# Patient Record
Sex: Male | Born: 1955 | Race: White | Hispanic: No | Marital: Married | Smoking: Former smoker
Health system: Southern US, Community
[De-identification: ages and names within clinical notes are randomized; demographics above are authoritative.]

## PROBLEM LIST (undated history)

## (undated) DIAGNOSIS — I10 Essential (primary) hypertension: Secondary | ICD-10-CM

## (undated) HISTORY — PX: LYMPHANGIOMA EXCISION: SHX1990

## (undated) HISTORY — PX: HAND SURGERY: SHX662

## (undated) HISTORY — PX: COLONOSCOPY: SHX174

## (undated) HISTORY — PX: CATARACT EXTRACTION: SUR2

## (undated) HISTORY — DX: Essential (primary) hypertension: I10

## (undated) HISTORY — PX: TONSILLECTOMY: SUR1361

## (undated) HISTORY — PX: CARDIAC VALVE REPLACEMENT: SHX585

## (undated) HISTORY — PX: HERNIA REPAIR: SHX51

## (undated) HISTORY — PX: VARICOSE VEIN SURGERY: SHX832

---

## 1998-08-05 ENCOUNTER — Ambulatory Visit (HOSPITAL_COMMUNITY): Admission: RE | Admit: 1998-08-05 | Discharge: 1998-08-05 | Payer: Self-pay | Admitting: Vascular Surgery

## 1998-08-22 ENCOUNTER — Ambulatory Visit (HOSPITAL_COMMUNITY): Admission: RE | Admit: 1998-08-22 | Discharge: 1998-08-22 | Payer: Self-pay | Admitting: Vascular Surgery

## 1998-10-14 ENCOUNTER — Ambulatory Visit (HOSPITAL_COMMUNITY): Admission: RE | Admit: 1998-10-14 | Discharge: 1998-10-14 | Payer: Self-pay | Admitting: General Surgery

## 2011-08-17 ENCOUNTER — Ambulatory Visit
Admission: RE | Admit: 2011-08-17 | Discharge: 2011-08-17 | Disposition: A | Discharge status: Home / Self Care | Payer: BC Managed Care – PPO | Source: Ambulatory Visit | Attending: Physician Assistant | Admitting: Physician Assistant

## 2011-08-17 ENCOUNTER — Other Ambulatory Visit: Payer: Self-pay | Admitting: Physician Assistant

## 2011-08-17 DIAGNOSIS — R059 Cough, unspecified: Secondary | ICD-10-CM

## 2011-08-17 DIAGNOSIS — R05 Cough: Secondary | ICD-10-CM

## 2012-05-30 ENCOUNTER — Other Ambulatory Visit: Payer: Self-pay | Admitting: Family Medicine

## 2012-05-30 DIAGNOSIS — E785 Hyperlipidemia, unspecified: Secondary | ICD-10-CM

## 2012-05-30 DIAGNOSIS — G43909 Migraine, unspecified, not intractable, without status migrainosus: Secondary | ICD-10-CM

## 2012-05-30 DIAGNOSIS — I10 Essential (primary) hypertension: Secondary | ICD-10-CM

## 2012-06-09 ENCOUNTER — Other Ambulatory Visit: Payer: Self-pay | Admitting: Family Medicine

## 2012-06-09 DIAGNOSIS — Z139 Encounter for screening, unspecified: Secondary | ICD-10-CM

## 2012-06-12 ENCOUNTER — Ambulatory Visit
Admission: RE | Admit: 2012-06-12 | Discharge: 2012-06-12 | Disposition: A | Discharge status: Home / Self Care | Payer: BC Managed Care – PPO | Source: Ambulatory Visit | Attending: Family Medicine | Admitting: Family Medicine

## 2012-06-12 DIAGNOSIS — Z139 Encounter for screening, unspecified: Secondary | ICD-10-CM

## 2012-06-12 DIAGNOSIS — I10 Essential (primary) hypertension: Secondary | ICD-10-CM

## 2012-06-12 DIAGNOSIS — E785 Hyperlipidemia, unspecified: Secondary | ICD-10-CM

## 2012-06-12 DIAGNOSIS — G43909 Migraine, unspecified, not intractable, without status migrainosus: Secondary | ICD-10-CM

## 2012-06-12 MED ORDER — GADOBENATE DIMEGLUMINE 529 MG/ML IV SOLN
20.0000 mL | Freq: Once | INTRAVENOUS | Status: AC | PRN
  Administered 2012-06-12: 20 mL via INTRAVENOUS

## 2014-02-19 ENCOUNTER — Ambulatory Visit (INDEPENDENT_AMBULATORY_CARE_PROVIDER_SITE_OTHER): Payer: BC Managed Care – PPO | Admitting: Family Medicine

## 2014-02-19 VITALS — BP 128/80 | HR 60 | Temp 98.4°F | Resp 17 | Ht 73.0 in | Wt 196.0 lb

## 2014-02-19 DIAGNOSIS — F411 Generalized anxiety disorder: Secondary | ICD-10-CM

## 2014-02-19 NOTE — Progress Notes (Signed)
Subjective:  This chart was scribed for Robyn Haber, MD by Donato Schultz, Medical Scribe. This patient was seen in Room 9 and the patient's care was started at 6:03 PM.   Patient ID: Matthew Kidd, male    DOB: 05-07-1956, 58 y.o.   MRN: [id number]  HPI HPI Comments: Matthew Kidd is a 58 y.o. male who presents to the [facility] complaining of increasingly worsening anxiety.  The patient states that he had a panic attack that occurred last night after he woke up from a dream that he was registering for Masters courses at [employer] and had everything taken from him.  He states that everyone else in the dream had direction except for him.  The patient's wife states that the panic attack lasted for 30 minutes and seemed like a psychotic episode as he kept screaming about registering for classes.  The patient states that he woke up at 4 AM this morning to let the dogs out, which is not abnormal for him, but he had a hard time going back to sleep because he was preoccupied with thoughts about work.  The patient's wife states that the patient has become increasingly negative and angry.  He states that he was given Xanax by Dr. Burna Mortimer but states that it has not helped his anxiety because it makes him sleepy.  The patient states that he has taken Seratonin inhibitors for his symptoms in the past with no relief.  The patient oversees an EMT program.  He states that work stresses him out because he is responsible for overseeing the entire program and he worries a lot for and about his students.  The patient states that he is interested in seeing a therapist but does not want anyone at his workplace to know about it.  He also states that he blames himself for his daughter getting a DUI.    Past Medical History  Diagnosis Date   Hypertension    Past Surgical History  Procedure Laterality Date   Hernia repair     Cardiac valve replacement     Family History  Problem Relation Age  of Onset   Cancer Mother    Cancer Father    Hypertension Father    Stroke Father    History   Social History   Marital Status: Married    Spouse Name: N/A    Number of Children: N/A   Years of Education: N/A   Occupational History   Not on file.   Social History Main Topics   Smoking status: Never Smoker    Smokeless tobacco: Not on file   Alcohol Use: No   Drug Use: No   Sexual Activity: No   Other Topics Concern   Not on file   Social History Narrative   No narrative on file   Allergies  Allergen Reactions   Codeine Other (See Comments)    Headache     Review of Systems  Psychiatric/Behavioral: Positive for sleep disturbance. The patient is nervous/anxious.   All other systems reviewed and are negative.     Objective:  Physical Exam I spent about 30 minutes he stays with patient him and his wife about his anxiety, anger issues, and delusional night last night.    BP 128/80   Pulse 60   Temp(Src) 98.4 F (36.9 C) (Oral)   Resp 17   Ht 6\' 1"  (1.854 m)   Wt 196 lb (88.905 kg)   BMI 25.86 kg/m2  SpO2 98% Assessment & Plan:  I personally performed the services described in this documentation, which was scribed in my presence. The recorded information has been reviewed and is accurate. Referral to psychiatry urgent  Signed, Carola Frost.D.

## 2016-10-06 ENCOUNTER — Other Ambulatory Visit: Payer: Self-pay | Admitting: Family Medicine

## 2016-10-06 ENCOUNTER — Ambulatory Visit
Admission: RE | Admit: 2016-10-06 | Discharge: 2016-10-06 | Disposition: A | Discharge status: Home / Self Care | Payer: BC Managed Care – PPO | Source: Ambulatory Visit | Attending: Family Medicine | Admitting: Family Medicine

## 2016-10-06 DIAGNOSIS — M25571 Pain in right ankle and joints of right foot: Secondary | ICD-10-CM

## 2016-11-30 ENCOUNTER — Encounter: Payer: Self-pay | Admitting: Podiatry

## 2016-11-30 ENCOUNTER — Ambulatory Visit (INDEPENDENT_AMBULATORY_CARE_PROVIDER_SITE_OTHER): Payer: BC Managed Care – PPO

## 2016-11-30 ENCOUNTER — Ambulatory Visit (INDEPENDENT_AMBULATORY_CARE_PROVIDER_SITE_OTHER): Payer: BC Managed Care – PPO | Admitting: Podiatry

## 2016-11-30 VITALS — BP 130/83 | HR 63 | Resp 16

## 2016-11-30 DIAGNOSIS — M79671 Pain in right foot: Secondary | ICD-10-CM

## 2016-11-30 DIAGNOSIS — M84374A Stress fracture, right foot, initial encounter for fracture: Secondary | ICD-10-CM

## 2016-11-30 MED ORDER — MELOXICAM 15 MG PO TABS: 15.0000 mg | ORAL_TABLET | Freq: Every day | ORAL | 3 refills | Status: DC

## 2016-11-30 NOTE — Progress Notes (Signed)
   Subjective:    Patient ID: Matthew Kidd, male    DOB: [birth year], 61 y.o.   MRN: [id number]  HPI: He presents today with chief complaint of pain to the right forefoot he says that approximately 2 months ago he jumped off his tractor landing on his foot with immediate pain. States the addendum is many times without pain but he immediately developed redness and swelling over the metatarsophalangeal joints 2 through 4 right foot he saw his primary care performed x-rays stated that there were no fractures he still having pain 2 months later and he states that it is getting better. He states that it feels better with stiff soled shoes. He just wants a second opinion today.    Review of Systems  Musculoskeletal: Positive for arthralgias.  Neurological: Positive for headaches.  All other systems reviewed and are negative.      Objective:   Physical Exam: Vital signs are stable he is alert and oriented 3. Pulses are palpable. Neurologic sensorium is intact. A tendon reflexes are intact. Muscle strength is 5 over 5 dorsiflexion plantar flexors and inverters everters on his musculature is intact. Orthopedic evaluation does not demonstrate any type of soft tissue abnormality overlying the metatarsophalangeal joints. He does have pain on palpation and only and range of motion of the metatarsophalangeal joints 234 with the worst being second and third. Radiographs taken today compared to previous demonstrate radiolucency in the heads of the second and third metatarsal into the medial aspect of the head of the fourth metatarsal on the right foot. This is a stress fracture of these metatarsal heads with no displacement there is no bone callus related with this cancellous fracture.        Assessment & Plan:  Assessment: Healing stress fractures metatarsals #2 #3 #4 right foot.  Plan: Continue to wear solid soled shoes for the next month or so no bare feet no flip flops or sandals. Follow-up with me in  a month if this is not resolving.

## 2017-06-17 ENCOUNTER — Other Ambulatory Visit: Payer: Self-pay | Admitting: Family Medicine

## 2017-06-17 DIAGNOSIS — R1030 Lower abdominal pain, unspecified: Secondary | ICD-10-CM

## 2018-02-13 IMAGING — CR DG FOOT COMPLETE 3+V*R*
3 series · 3 of 3 positions shown · non-contrast
Comparison: No recent prior.

CLINICAL DATA: Foot pain.  Injury.

EXAM:
RIGHT FOOT COMPLETE - 3+ VIEW

[x foot ap right]
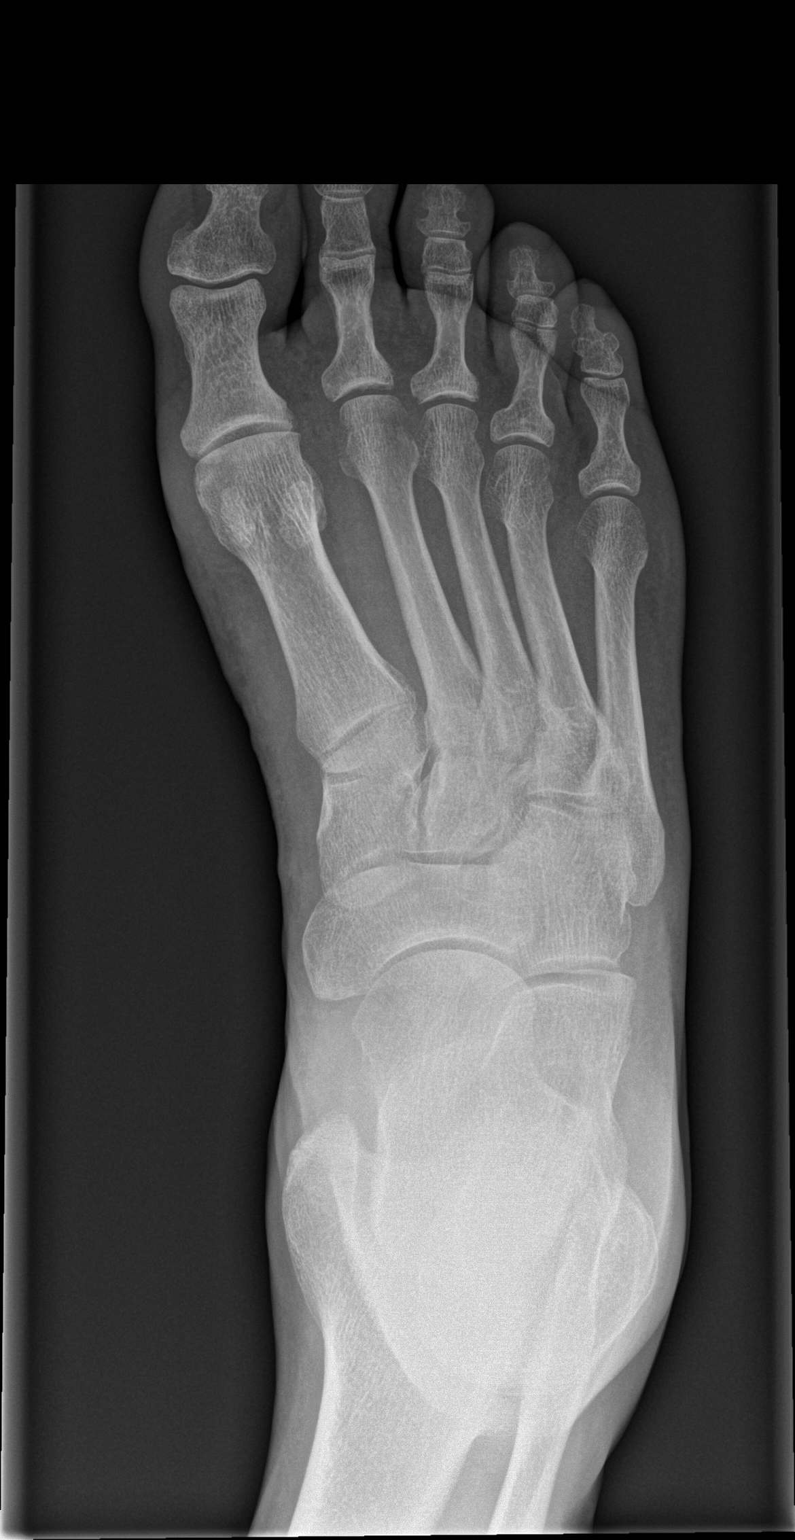

[x foot obl right]
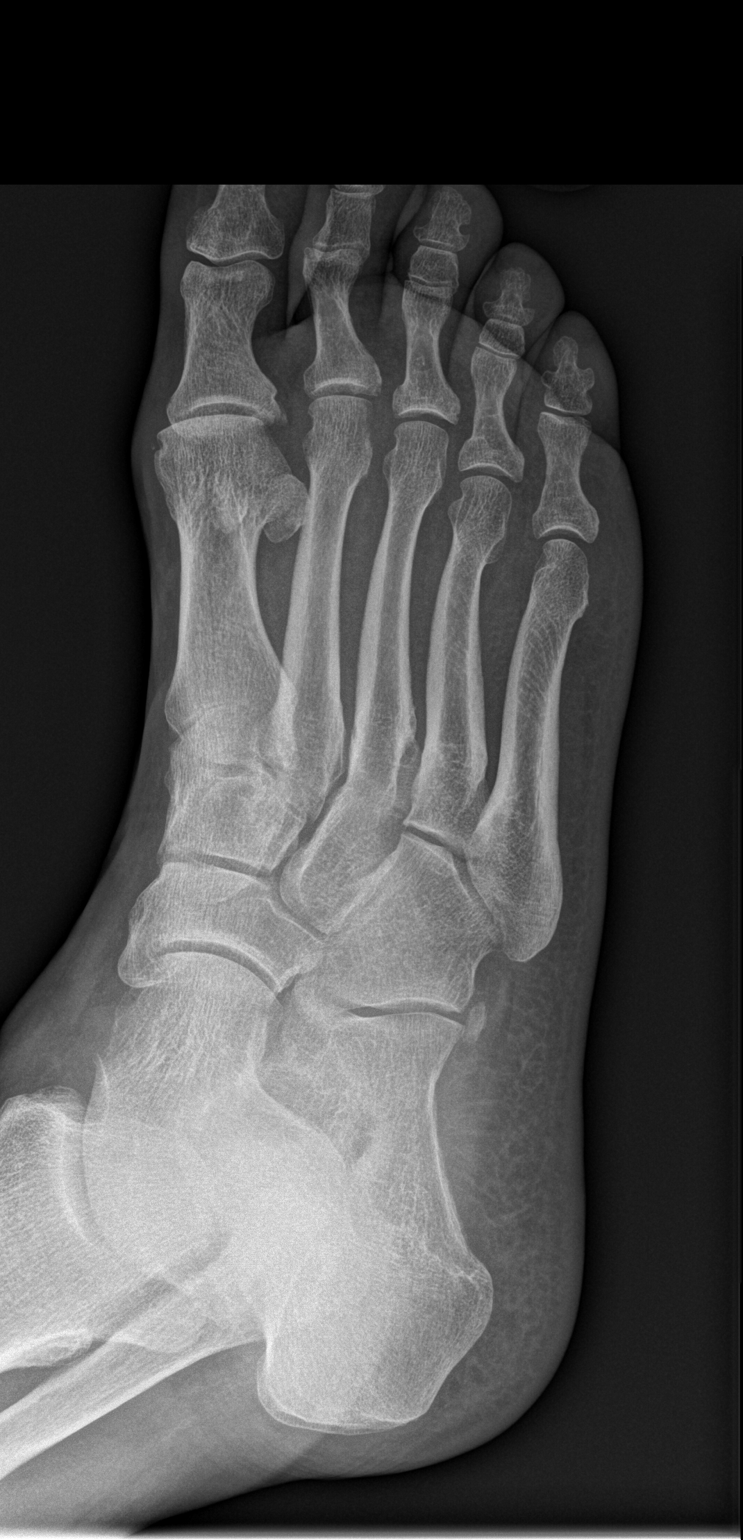

[x foot lat right]
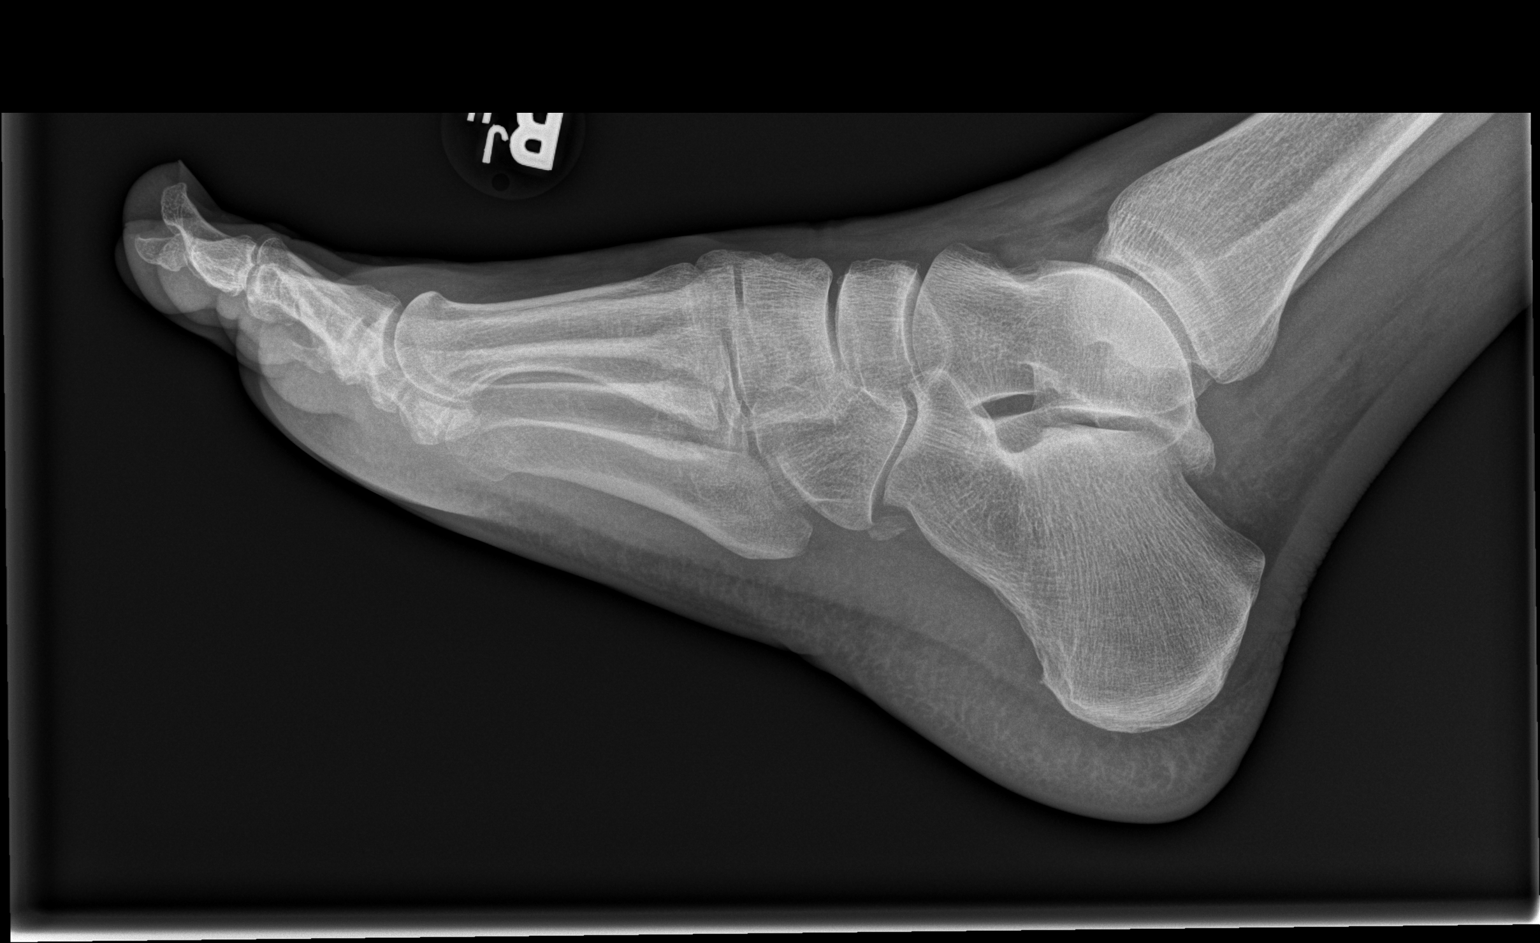

[3 of 3 positions shown; findings below may reference images not displayed]

FINDINGS: No acute bony or joint abnormality identified. No evidence of
fracture dislocation.
IMPRESSION: Diffuse mild degenerative change.  No acute abnormality.

## 2020-01-15 DIAGNOSIS — Z86010 Personal history of colonic polyps: Secondary | ICD-10-CM | POA: Insufficient documentation

## 2020-12-23 ENCOUNTER — Other Ambulatory Visit: Payer: Self-pay

## 2020-12-23 ENCOUNTER — Encounter: Payer: Self-pay | Admitting: Dermatology

## 2020-12-23 ENCOUNTER — Ambulatory Visit: Payer: Medicare PPO | Admitting: Dermatology

## 2020-12-23 DIAGNOSIS — Z1283 Encounter for screening for malignant neoplasm of skin: Secondary | ICD-10-CM | POA: Diagnosis not present

## 2020-12-23 DIAGNOSIS — I878 Other specified disorders of veins: Secondary | ICD-10-CM

## 2020-12-23 DIAGNOSIS — D485 Neoplasm of uncertain behavior of skin: Secondary | ICD-10-CM

## 2020-12-23 DIAGNOSIS — L570 Actinic keratosis: Secondary | ICD-10-CM

## 2020-12-23 DIAGNOSIS — C4492 Squamous cell carcinoma of skin, unspecified: Secondary | ICD-10-CM

## 2020-12-23 DIAGNOSIS — L821 Other seborrheic keratosis: Secondary | ICD-10-CM | POA: Diagnosis not present

## 2020-12-23 DIAGNOSIS — D0439 Carcinoma in situ of skin of other parts of face: Secondary | ICD-10-CM

## 2020-12-23 HISTORY — DX: Squamous cell carcinoma of skin, unspecified: C44.92

## 2020-12-23 NOTE — Patient Instructions (Signed)

## 2020-12-28 ENCOUNTER — Encounter: Payer: Self-pay | Admitting: Dermatology

## 2020-12-28 NOTE — Progress Notes (Signed)
Follow-Up Visit   Subjective  Matthew Kidd is a 65 y.o. male who presents for the following: Annual Exam (Right brow odd red per patient previous ak, check post neck x years).  General skin examination Location: Possible new spots above right eyebrow and in front of right ear Duration:  Quality:  Associated Signs/Symptoms: Modifying Factors:  Severity:  Timing: Context:   Objective  Well appearing patient in no apparent distress; mood and affect are within normal limits. Objective  Chest - Medial [facility]): Head to toe exam today no signs of atypical moles or melanoma.  Objective  Right Preauricular Area: Endophytic 6 mm pink waxy crust       Objective  Above Right Brow:  Two 4 mm waxy pink crusts on either side of a 4 mm white scar       Objective  Left Chest: Light brown textured 6 mm flattopped papules  Objective  Right Scrotum: 3 mm nontender mobile firm subcutaneous nodule adjacent to a small scrotal varicosity  Objective  Left Superior Helix, Right Scaphoid Fossa: 2 to 4 mm hornlike crusts   A full examination was performed including scalp, head, eyes, ears, nose, lips, neck, chest, axillae, abdomen, back, buttocks, bilateral upper extremities, bilateral lower extremities, hands, feet, fingers, toes, fingernails, and toenails. All findings within normal limits unless otherwise noted below.   Assessment & Plan    Screening exam for skin cancer Chest - Medial [facility])  Annual skin examination  Neoplasm of uncertain behavior of skin (2) Right Preauricular Area  Skin / nail biopsy Type of biopsy: tangential   Informed consent: discussed and consent obtained   Timeout: patient name, date of birth, surgical site, and procedure verified   Procedure prep:  Patient was prepped and draped in usual sterile fashion (Non sterile) Prep type:  Chlorhexidine Anesthesia: the lesion was anesthetized in a standard fashion   Anesthetic:  1% lidocaine  w/ epinephrine 1-100,000 local infiltration Instrument used: flexible razor blade   Hemostasis achieved with: ferric subsulfate   Outcome: patient tolerated procedure well   Post-procedure details: sterile dressing applied and wound care instructions given   Dressing type: petrolatum and bandage    Specimen 1 - Surgical pathology Differential Diagnosis: R/O BCC vs SCC  Check Margins: No  Above Right Brow  Skin / nail biopsy Type of biopsy: tangential   Informed consent: discussed and consent obtained   Timeout: patient name, date of birth, surgical site, and procedure verified   Procedure prep:  Patient was prepped and draped in usual sterile fashion (Non sterile) Prep type:  Chlorhexidine Anesthesia: the lesion was anesthetized in a standard fashion   Anesthetic:  1% lidocaine w/ epinephrine 1-100,000 local infiltration Instrument used: flexible razor blade   Hemostasis achieved with: ferric subsulfate   Outcome: patient tolerated procedure well   Post-procedure details: sterile dressing applied and wound care instructions given   Dressing type: bandage and petrolatum    Specimen 2 - Surgical pathology Differential Diagnosis: R/O BCC vs SCC  Check Margins: No  [id number]  Seborrheic keratosis Left Chest  Leave if stable  Phlebolith Right Scrotum  Historically asymptomatic and stable; recheck if changes  AK (actinic keratosis) (2) Left Superior Helix; Right Scaphoid Fossa  Destruction of lesion - Left Superior Helix, Right Scaphoid Fossa Complexity: simple   Destruction method: cryotherapy   Informed consent: discussed and consent obtained   Timeout:  patient name, date of birth, surgical site, and procedure verified Lesion destroyed using liquid nitrogen: Yes  Cryotherapy cycles:  5 Outcome: patient tolerated procedure well with no complications   Post-procedure details: wound care instructions given       I, Lavonna Monarch, MD, have reviewed all  documentation for this visit.  The documentation on 12/28/20 for the exam, diagnosis, procedures, and orders are all accurate and complete.

## 2020-12-31 ENCOUNTER — Telehealth: Payer: Self-pay

## 2020-12-31 NOTE — Telephone Encounter (Signed)
Phone call to patient with his pathology results.  Pathology results given to patient's wife.

## 2020-12-31 NOTE — Telephone Encounter (Signed)
-----   Message from Lavonna Monarch, MD sent at 12/30/2020  5:57 AM EST ----- Schedule surgery with Dr. Darene Lamer

## 2021-02-10 DIAGNOSIS — E7800 Pure hypercholesterolemia, unspecified: Secondary | ICD-10-CM | POA: Diagnosis not present

## 2021-02-10 DIAGNOSIS — I10 Essential (primary) hypertension: Secondary | ICD-10-CM | POA: Diagnosis not present

## 2021-02-10 DIAGNOSIS — W57XXXA Bitten or stung by nonvenomous insect and other nonvenomous arthropods, initial encounter: Secondary | ICD-10-CM | POA: Diagnosis not present

## 2021-02-10 DIAGNOSIS — G43909 Migraine, unspecified, not intractable, without status migrainosus: Secondary | ICD-10-CM | POA: Diagnosis not present

## 2021-02-26 ENCOUNTER — Ambulatory Visit (INDEPENDENT_AMBULATORY_CARE_PROVIDER_SITE_OTHER): Payer: Medicare PPO | Admitting: Dermatology

## 2021-02-26 ENCOUNTER — Encounter: Payer: Self-pay | Admitting: Dermatology

## 2021-02-26 ENCOUNTER — Other Ambulatory Visit: Payer: Self-pay

## 2021-02-26 DIAGNOSIS — D485 Neoplasm of uncertain behavior of skin: Secondary | ICD-10-CM

## 2021-02-26 DIAGNOSIS — D044 Carcinoma in situ of skin of scalp and neck: Secondary | ICD-10-CM | POA: Diagnosis not present

## 2021-02-26 DIAGNOSIS — D0439 Carcinoma in situ of skin of other parts of face: Secondary | ICD-10-CM

## 2021-02-26 DIAGNOSIS — D099 Carcinoma in situ, unspecified: Secondary | ICD-10-CM

## 2021-02-26 DIAGNOSIS — L570 Actinic keratosis: Secondary | ICD-10-CM | POA: Diagnosis not present

## 2021-02-26 NOTE — Patient Instructions (Signed)

## 2021-03-14 ENCOUNTER — Encounter: Payer: Self-pay | Admitting: Dermatology

## 2021-03-14 NOTE — Progress Notes (Signed)
Follow-Up Visit   Subjective  Matthew Kidd is a 65 y.o. male who presents for the following: Procedure (Scc x1 ).  Biopsy-proven CIS left cheek plus several other crusts Location:  Duration:  Quality:  Associated Signs/Symptoms: Modifying Factors:  Severity:  Timing: Context:   Objective  Well appearing patient in no apparent distress; mood and affect are within normal limits. Objective  Right Preauricular Area: Lesion identified by Dr.Kaydin Karbowski and nurse in room.    Objective  Left Scaphoid Fossa: 3 mm hornlike pink crust  Objective  Left Anterior Neck: 1.2 cm thick pink crust    A focused examination was performed including Head and neck.. Relevant physical exam findings are noted in the Assessment and Plan.   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  Destruction of lesion Complexity: simple   Destruction method: electrodesiccation and curettage   Informed consent: discussed and consent obtained   Timeout:  patient name, date of birth, surgical site, and procedure verified Anesthesia: the lesion was anesthetized in a standard fashion   Anesthetic:  1% lidocaine w/ epinephrine 1-100,000 local infiltration Curettage performed in three different directions: Yes   Curettage cycles:  3 Lesion length (cm):  1 Lesion width (cm):  1 Margin per side (cm):  0 Final wound size (cm):  1 Hemostasis achieved with:  ferric subsulfate Outcome: patient tolerated procedure well with no complications   Additional details:  Wound innoculated with 5 fluorouracil solution.  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  Skin / nail biopsy Type of biopsy: tangential   Informed consent: discussed and consent obtained   Timeout: patient name, date of birth, surgical site, and procedure verified   Procedure prep:  Patient was prepped and draped in usual sterile fashion (Non sterile) Prep type:  Chlorhexidine Anesthesia: the lesion was anesthetized in a standard fashion   Anesthetic:  1% lidocaine w/ epinephrine 1-100,000 local infiltration Instrument used: flexible razor blade   Outcome: patient tolerated procedure well   Post-procedure details: wound care instructions given    Destruction of lesion Complexity: simple   Destruction method: electrodesiccation and curettage   Informed consent: discussed and consent obtained   Timeout:  patient name, date of birth, surgical site, and procedure verified Anesthesia: the lesion was anesthetized in a standard fashion   Anesthetic:  1% lidocaine w/ epinephrine 1-100,000 local infiltration Curettage performed in three different directions: Yes   Curettage cycles:  3 Lesion length (cm):  1.6 Lesion width (cm):  1.6 Margin per side (cm):  0 Final wound size (cm):  1.6 Hemostasis achieved with:  ferric subsulfate Outcome: patient tolerated procedure well with no complications   Additional details:  Wound innoculated with 5 fluorouracil solution.  Specimen 1 - Surgical pathology Differential Diagnosis: bcc scc tx with curet and cautery 61fu  Check Margins: No  After shave biopsy the base was treated with triple curettage and cautery plus inoculated with parenteral fluorouracil.      I, Lavonna Monarch, MD, have reviewed  all documentation for this visit.  The documentation on 03/14/21 for the exam, diagnosis, procedures, and orders are all accurate and complete.

## 2021-03-26 DIAGNOSIS — H2513 Age-related nuclear cataract, bilateral: Secondary | ICD-10-CM | POA: Diagnosis not present

## 2021-04-09 DIAGNOSIS — H2511 Age-related nuclear cataract, right eye: Secondary | ICD-10-CM | POA: Diagnosis not present

## 2021-04-09 DIAGNOSIS — H353132 Nonexudative age-related macular degeneration, bilateral, intermediate dry stage: Secondary | ICD-10-CM | POA: Diagnosis not present

## 2021-04-24 DIAGNOSIS — H2511 Age-related nuclear cataract, right eye: Secondary | ICD-10-CM | POA: Diagnosis not present

## 2021-05-11 DIAGNOSIS — H2513 Age-related nuclear cataract, bilateral: Secondary | ICD-10-CM | POA: Diagnosis not present

## 2021-05-11 DIAGNOSIS — H2512 Age-related nuclear cataract, left eye: Secondary | ICD-10-CM | POA: Diagnosis not present

## 2021-06-20 DIAGNOSIS — R519 Headache, unspecified: Secondary | ICD-10-CM | POA: Diagnosis not present

## 2021-06-20 DIAGNOSIS — U071 COVID-19: Secondary | ICD-10-CM | POA: Diagnosis not present

## 2021-06-20 DIAGNOSIS — R0981 Nasal congestion: Secondary | ICD-10-CM | POA: Diagnosis not present

## 2021-06-20 DIAGNOSIS — R110 Nausea: Secondary | ICD-10-CM | POA: Diagnosis not present

## 2021-08-18 DIAGNOSIS — E7800 Pure hypercholesterolemia, unspecified: Secondary | ICD-10-CM | POA: Diagnosis not present

## 2021-08-18 DIAGNOSIS — N486 Induration penis plastica: Secondary | ICD-10-CM | POA: Diagnosis not present

## 2021-08-18 DIAGNOSIS — I10 Essential (primary) hypertension: Secondary | ICD-10-CM | POA: Diagnosis not present

## 2021-08-18 DIAGNOSIS — Z125 Encounter for screening for malignant neoplasm of prostate: Secondary | ICD-10-CM | POA: Diagnosis not present

## 2021-09-15 ENCOUNTER — Ambulatory Visit: Payer: Medicare PPO | Admitting: Podiatry

## 2021-09-15 ENCOUNTER — Other Ambulatory Visit: Payer: Self-pay

## 2021-09-15 ENCOUNTER — Ambulatory Visit (INDEPENDENT_AMBULATORY_CARE_PROVIDER_SITE_OTHER): Payer: Medicare PPO

## 2021-09-15 ENCOUNTER — Encounter: Payer: Self-pay | Admitting: Podiatry

## 2021-09-15 DIAGNOSIS — M778 Other enthesopathies, not elsewhere classified: Secondary | ICD-10-CM

## 2021-09-15 MED ORDER — METHYLPREDNISOLONE 4 MG PO TBPK: ORAL_TABLET | ORAL | 0 refills | Status: DC

## 2021-09-16 NOTE — Progress Notes (Signed)
Subjective:  Patient ID: Matthew Kidd, male    DOB: 1956-10-06,  MRN: [id number] HPI Chief Complaint  Patient presents with   Foot Pain    1st MPJ left - area was red, swollen, purple and very painful x 2 weeks ago, better, but still some discomfort, limited ROM, some 5th met base pain   Toe Pain    Hallux right - lateral border, tender x few weeks   New Patient (Initial Visit)    Est pt 2018    65 y.o. male presents with the above complaint.   ROS: Denies fever chills nausea vomiting muscle aches pains calf pain back pain chest pain shortness of breath.  Past Medical History:  Diagnosis Date   Hypertension    SCCA (squamous cell carcinoma) of skin 12/23/2020   Right Preauricular Area (in situ)   Past Surgical History:  Procedure Laterality Date   CARDIAC VALVE REPLACEMENT     HERNIA REPAIR      Current Outpatient Medications:    hydrochlorothiazide (HYDRODIURIL) 12.5 MG tablet, Take 12.5 mg by mouth daily., Disp: , Rfl:    methylPREDNISolone (MEDROL DOSEPAK) 4 MG TBPK tablet, 6 day dose pack - take as directed, Disp: 21 tablet, Rfl: 0   metoprolol succinate (TOPROL-XL) 100 MG 24 hr tablet, Take 100 mg by mouth daily. Take with or immediately following a meal., Disp: , Rfl:    olmesartan (BENICAR) 40 MG tablet, Take 1 tablet by mouth daily., Disp: , Rfl:   Allergies  Allergen Reactions   Codeine Other (See Comments)    Headache    Review of Systems Objective:  There were no vitals filed for this visit.  General: Well developed, nourished, in no acute distress, alert and oriented x3   Dermatological: Skin is warm, dry and supple bilateral. Nails x 10 are well maintained; remaining integument appears unremarkable at this time. There are no open sores, no preulcerative lesions, no rash or signs of infection present.  Hallux right lateral side of the nail does demonstrate sharply abraded nail margin with mild erythema no cellulitis drainage or odor at this time.  Tender  on palpation.  Vascular: Dorsalis Pedis artery and Posterior Tibial artery pedal pulses are 2/4 bilateral with immedate capillary fill time. Pedal hair growth present. No varicosities and no lower extremity edema present bilateral.   Neruologic: Grossly intact via light touch bilateral. Vibratory intact via tuning fork bilateral. Protective threshold with Semmes Wienstein monofilament intact to all pedal sites bilateral. Patellar and Achilles deep tendon reflexes 2+ bilateral. No Babinski or clonus noted bilateral.   Musculoskeletal: No gross boney pedal deformities bilateral. No pain, crepitus, or limitation noted with foot and ankle range of motion bilateral. Muscular strength 5/5 in all groups tested bilateral.  Left first metatarsophalangeal joint demonstrates mild erythema warmth and swelling.  Tender to the touch and limited range of motion.  Gait: Unassisted, Nonantalgic.    Radiographs:  Radiographs taken reviewed demonstrate osseously mature individual soft tissue swelling around the first metatarsophalangeal joint of the left foot with some spurring noted.  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  Discussed injecting around the first metatarsophalangeal joint but at this time he does not want to do this he would rather take oral medication if possible.  So  we started him on methylprednisolone.  We will also follow-up with him in 3 weeks not only to recheck the first metatarsophalangeal joint left but if appropriate we will perform a chemical matricectomy to the fibular border of the hallux right.  Should this patient called stating that he has a red hot swollen toe or gout attack we are to provide him with a requisition  for blood work consisting of a CBC and an arthritic profile including uric acid sed rate RA factor and ANA and a C-reactive protein.     [name] T. [place], [place]

## 2021-09-24 ENCOUNTER — Telehealth: Payer: Self-pay | Admitting: *Deleted

## 2021-09-24 ENCOUNTER — Other Ambulatory Visit: Payer: Self-pay | Admitting: Podiatry

## 2021-09-24 DIAGNOSIS — M778 Other enthesopathies, not elsewhere classified: Secondary | ICD-10-CM

## 2021-09-24 MED ORDER — COLCHICINE 0.6 MG PO TABS: ORAL_TABLET | ORAL | 3 refills | Status: DC

## 2021-09-24 NOTE — Telephone Encounter (Signed)
Called patient , no answer, left vmessage that a prescription has been sent into pharmacy and to come by office to pick up paper work to have lab work done.

## 2021-09-24 NOTE — Telephone Encounter (Signed)
Should this patient call stating that he has a red hot swollen toe or gout attack we are to provide him with a requisition for blood work consisting of a CBC and an arthritic profile including uric acid sed rate RA factor and ANA and a C-reactive protein.  Can you place these orders and have patient come and pick up?  I will send him in colchicine to his pharmacy, this medication will upset his stomach and that is normal... just to caution him.

## 2021-09-24 NOTE — Addendum Note (Signed)
Addended by: Rip Harbour on: 09/24/2021 04:39 PM   Modules accepted: Orders

## 2021-09-24 NOTE — Telephone Encounter (Signed)
Patient is calling because he is having acute symptoms of gout, toe is very red, was told to call if this happened.Please advise.

## 2021-09-25 DIAGNOSIS — M778 Other enthesopathies, not elsewhere classified: Secondary | ICD-10-CM | POA: Diagnosis not present

## 2021-09-25 NOTE — Telephone Encounter (Signed)
Patient called back and stated he will be by to pick up lab paper work today

## 2021-09-28 LAB — CBC WITH DIFFERENTIAL/PLATELET
Absolute Monocytes: 1103 cells/uL — ABNORMAL HIGH (ref 200–950)
Basophils Absolute: 21 cells/uL (ref 0–200)
Basophils Relative: 0.2 %
Eosinophils Absolute: 42 cells/uL (ref 15–500)
Eosinophils Relative: 0.4 %
HCT: 46.2 % (ref 38.5–50.0)
Hemoglobin: 15.5 g/dL (ref 13.2–17.1)
Lymphs Abs: 1407 cells/uL (ref 850–3900)
MCH: 30.4 pg (ref 27.0–33.0)
MCHC: 33.5 g/dL (ref 32.0–36.0)
MCV: 90.6 fL (ref 80.0–100.0)
MPV: 9.4 fL (ref 7.5–12.5)
Monocytes Relative: 10.5 %
Neutro Abs: 7928 cells/uL — ABNORMAL HIGH (ref 1500–7800)
Neutrophils Relative %: 75.5 %
Platelets: 197 10*3/uL (ref 140–400)
RBC: 5.1 10*6/uL (ref 4.20–5.80)
RDW: 13.1 % (ref 11.0–15.0)
Total Lymphocyte: 13.4 %
WBC: 10.5 10*3/uL (ref 3.8–10.8)

## 2021-09-28 LAB — ANA: Anti Nuclear Antibody (ANA): NEGATIVE

## 2021-09-28 LAB — SEDIMENTATION RATE: Sed Rate: 14 mm/h (ref 0–20)

## 2021-09-28 LAB — C-REACTIVE PROTEIN: CRP: 115.5 mg/L — ABNORMAL HIGH (ref <8.0)

## 2021-09-28 LAB — RHEUMATOID FACTOR: Rhuematoid fact SerPl-aCnc: <14 IU/mL (ref <14)

## 2021-09-28 LAB — URIC ACID: Uric Acid, Serum: 6.2 mg/dL (ref 4.0–8.0)

## 2021-10-02 ENCOUNTER — Telehealth: Payer: Self-pay | Admitting: *Deleted

## 2021-10-02 DIAGNOSIS — R7982 Elevated C-reactive protein (CRP): Secondary | ICD-10-CM

## 2021-10-02 NOTE — Telephone Encounter (Signed)
-----   Message from Garrel Ridgel, [place] sent at 10/01/2021  5:06 PM EDT ----- Please send to rheumatology.

## 2021-10-08 ENCOUNTER — Other Ambulatory Visit: Payer: Self-pay

## 2021-10-08 ENCOUNTER — Encounter: Payer: Self-pay | Admitting: Podiatry

## 2021-10-08 ENCOUNTER — Ambulatory Visit: Payer: Medicare PPO | Admitting: Podiatry

## 2021-10-08 DIAGNOSIS — M778 Other enthesopathies, not elsewhere classified: Secondary | ICD-10-CM | POA: Diagnosis not present

## 2021-10-08 DIAGNOSIS — L309 Dermatitis, unspecified: Secondary | ICD-10-CM | POA: Insufficient documentation

## 2021-10-08 DIAGNOSIS — G43909 Migraine, unspecified, not intractable, without status migrainosus: Secondary | ICD-10-CM | POA: Insufficient documentation

## 2021-10-08 DIAGNOSIS — H269 Unspecified cataract: Secondary | ICD-10-CM | POA: Insufficient documentation

## 2021-10-08 DIAGNOSIS — F324 Major depressive disorder, single episode, in partial remission: Secondary | ICD-10-CM | POA: Insufficient documentation

## 2021-10-08 DIAGNOSIS — F419 Anxiety disorder, unspecified: Secondary | ICD-10-CM | POA: Insufficient documentation

## 2021-10-08 DIAGNOSIS — I10 Essential (primary) hypertension: Secondary | ICD-10-CM | POA: Insufficient documentation

## 2021-10-08 DIAGNOSIS — L600 Ingrowing nail: Secondary | ICD-10-CM

## 2021-10-08 DIAGNOSIS — N486 Induration penis plastica: Secondary | ICD-10-CM | POA: Insufficient documentation

## 2021-10-08 DIAGNOSIS — E7800 Pure hypercholesterolemia, unspecified: Secondary | ICD-10-CM | POA: Insufficient documentation

## 2021-10-08 MED ORDER — DEXAMETHASONE SODIUM PHOSPHATE 120 MG/30ML IJ SOLN
2.0000 mg | Freq: Once | INTRAMUSCULAR | Status: AC
  Administered 2021-10-08: 2 mg via INTRA_ARTICULAR

## 2021-10-08 MED ORDER — NEOMYCIN-POLYMYXIN-HC 1 % OT SOLN: OTIC | 1 refills | Status: DC

## 2021-10-08 NOTE — Progress Notes (Signed)
He presents today for follow-up of his gouty capsulitis left he states that is better but is still sore hard to walk he also complaining about ingrown toenail to the fibular border of the hallux right.  Objective: Vital signs stable he is alert orient x3 still has tenderness and warmth on palpation of the first metatarsophalangeal joint of the left foot moderately tender on range of motion.  His lab results did not demonstrate an elevated uric acid rather a moderately elevated C-reactive protein and I would like rheumatology to take a look at him for that.  Otherwise ingrown toenail fibular border of the hallux right exquisitely tender with mild erythema no purulence no malodor.  Assessment: Ingrown toenail fibular border hallux right residual gouty capsulitis first metatarsophalangeal joint left.  Plan: Discussed etiology pathology and surgical therapies when had injected the first metatarsophalangeal joint today 4 mg of dexamethasone local anesthetic performed a chemical matricectomy to the fibular border of the hallux right after local anesthetic was administered.  Tolerated procedure well he was provided both oral and at home instruction for the care and soaking of the toe and I will follow-up with him in 2 weeks.  He was also provided a prescription for Cortisporin Otic to be applied twice daily after soaking.

## 2021-10-08 NOTE — Patient Instructions (Signed)

## 2021-10-20 ENCOUNTER — Ambulatory Visit: Payer: Medicare PPO | Admitting: Podiatry

## 2021-10-20 ENCOUNTER — Other Ambulatory Visit: Payer: Self-pay

## 2021-10-20 DIAGNOSIS — L600 Ingrowing nail: Secondary | ICD-10-CM

## 2021-10-20 NOTE — Progress Notes (Signed)
He presents today for follow-up of his matrixectomy states that is doing just fine and still has a little bit of drainage she continues to soak it in Betadine and warm water.  Objective: Fibular border of the hallux right demonstrates no no edema cellulitis or odor.  There is some mild drainage but appears to be serosanguineous and is nontender on palpation.  Assessment: Mild paronychia resolving from a matrixectomy.  Plan: Unguinal follow-up with him on an as-needed basis but I am requesting that he continue to soak Epson salts and warm water covered during the day leave open at nighttime and continue Cortisporin Otic till gone.

## 2021-12-02 NOTE — Progress Notes (Signed)
Office Visit Note  Patient: Matthew Kidd             Date of Birth: Nov 14, 1956           MRN: [id number]             PCP: Shirline Frees, MD Referring: Garrel Ridgel, [place] Visit Date: 12/03/2021   Subjective:  New Patient (Initial Visit) (Abnormal labs, left great toe, swelling, turns purple, limited range of motion, right middle finger locking)   History of Present Illness: Matthew Kidd is a 66 y.o. male here for evaluation with left MTP pain and swelling.  He has had a total of 4 episodes during the past year more recent ones have been less severe due to improving with medication treatment.  This was initially suspected as gout and improved after local steroid injection and oral methylprednisolone taper.  Subsequent episodes have improved with colchicine though he has a lot of diarrhea when taking the medication which is also caused some exacerbation of hemorrhoids and other GI complaints. He has no prior history of gout before this past year.  No known family history of gout or other inflammatory joint diseases.  He has never had a kidney stone.  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  He had a history of multiple venous ablation procedures on the left leg.  Otherwise no history of blood clots no history of coronary or peripheral arterial disease. Labs demonstrated CRP elevation at 115.5 otherwise unremarkable with a normal uric acid at the time of 6.1.  X-ray demonstrated moderately advanced osteoarthritis but also possible well-circumscribed erosive change at the first proximal phalanx.  Labs reviewed 08/2021 ANA neg RF neg Uric acid 6.1 ESR 14 CRP 115.5  Activities of Daily Living:  Patient reports morning stiffness for 15 minutes.   Patient Reports nocturnal pain.  Difficulty dressing/grooming: Reports Difficulty climbing stairs: Denies Difficulty getting out of chair: Denies Difficulty using hands for taps, buttons,  cutlery, and/or writing: Denies  Review of Systems  Constitutional:  Negative for fatigue.  HENT:  Negative for mouth dryness.   Eyes:  Positive for dryness.  Respiratory:  Negative for shortness of breath.   Cardiovascular:  Positive for swelling in legs/feet.  Gastrointestinal:  Negative for constipation.  Genitourinary:  Negative for difficulty urinating.  Musculoskeletal:  Positive for joint pain, joint pain, joint swelling and morning stiffness.  Skin:  Negative for rash.  Allergic/Immunologic: Negative for susceptible to infections.  Neurological:  Negative for numbness.  Hematological:  Negative for bruising/bleeding tendency.  Psychiatric/Behavioral:  Negative for sleep disturbance.    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/08/2021   Migraine 10/08/2021   Pure hypercholesterolemia 10/08/2021   History of adenomatous polyp of colon 01/15/2020    Past Medical History:  Diagnosis Date   Hypertension    SCCA (squamous cell carcinoma) of skin 12/23/2020   Right Preauricular Area (in situ)    Family History  Problem Relation Age of Onset   Cancer Mother    Cancer Father    Hypertension Father    Stroke Father    Past Surgical History:  Procedure Laterality Date  HERNIA REPAIR     LYMPHANGIOMA EXCISION     VARICOSE VEIN SURGERY     Social History   Social History Narrative   Not on file   Immunization History  Administered Date(s) Administered   Influenza Split 11/17/2009, 08/11/2010, 08/18/2011, 09/03/2014   Influenza,inj,Quad PF,6+ Mos 07/22/2017, 08/04/2018   Influenza,inj,quad, With Preservative 10/19/2013, 07/23/2016   Influenza-Unspecified 07/28/2015    MMR 01/24/2007   PFIZER(Purple Top)SARS-COV-2 Vaccination 12/02/2019, 01/27/2020   Pfizer Covid-19 Vaccine Bivalent Booster 73yr & up 08/31/2021   Tdap 08/17/2013     Objective: Vital Signs: BP (!) 148/90 (BP Location: Right Arm, Patient Position: Sitting, Cuff Size: Normal)    Pulse 72    Resp 17    Ht 6' (1.829 m)    Wt 193 lb (87.5 kg)    BMI 26.18 kg/m    Physical Exam HENT:     Mouth/Throat:     Mouth: Mucous membranes are moist.     Pharynx: Oropharynx is clear.  Eyes:     Conjunctiva/sclera: Conjunctivae normal.  Cardiovascular:     Rate and Rhythm: Normal rate and regular rhythm.     Comments: Bilateral palpable PT and DP pulses Pulmonary:     Effort: Pulmonary effort is normal.     Breath sounds: Normal breath sounds.  Musculoskeletal:     Right lower leg: No edema.     Left lower leg: No edema.  Skin:    General: Skin is warm and dry.  Neurological:     General: No focal deficit present.     Mental Status: He is alert.  Psychiatric:        Mood and Affect: Mood normal.     Musculoskeletal Exam:  Neck full ROM no tenderness Shoulders full ROM no tenderness or swelling Elbows full ROM no tenderness or swelling Wrists full ROM no tenderness or swelling Right hand scarring over third finger flexor tendon with some mild catching and tenderness no severely decreased range of motion Knees full ROM no tenderness or swelling Ankles full ROM no tenderness or swelling Violaceous coloration of the left first MTP on the medial side no tenderness or swelling, mildly decreased range of motion of first MTPs bilaterally   Investigation: No additional findings.  Imaging: No results found.  Recent Labs: Lab Results  Component Value Date   WBC 10.5 09/25/2021   HGB 15.5 09/25/2021   PLT 197 09/25/2021    Speciality Comments: No specialty comments available.  Procedures:  No procedures performed Allergies: Codeine   Assessment / Plan:     Visit Diagnoses:  Podagra - Plan: Uric acid, C-reactive protein  Symptoms appear very consistent with gouty arthritis and possible single erosive change on x-ray also looks consistent with this.  His fairly normal uric acid may be unreliable as it was checked during acute flare we will repeat this today.  Also rechecking C-reactive protein now that he is outside of a current episode.  Given for attacks within the past year we will plan to start urate lowering treatment with allopurinol probably initial dose 150 mg daily.  Agree with continuing the as needed colchicine for symptom recurrences.  I discussed it can also be beneficial to take another look if he has a very bad flare again to better confirm diagnosis and plan.  Elevated C-reactive protein (CRP)  No evidence of ischemic or infectious causes at this time. Palpable distal peripheral pulses and good perfusion. Localized nail changes on right great toe no other cellulitis.  Rechecking this today.  Orders: Orders Placed This Encounter  Procedures   Uric acid   C-reactive protein   No orders of the defined types were placed in this encounter.    Follow-Up Instructions: Return in about 1 month (around 01/04/2022) for New pt gout allopurinol start f/u 4wks.   Collier Salina, MD  Note - This record has been created using [employer].  Chart creation errors have been sought, but may not always  have been located. Such creation errors do not reflect on  the standard of medical care.

## 2021-12-03 ENCOUNTER — Other Ambulatory Visit: Payer: Self-pay

## 2021-12-03 ENCOUNTER — Ambulatory Visit: Payer: Medicare PPO | Admitting: Internal Medicine

## 2021-12-03 ENCOUNTER — Encounter: Payer: Self-pay | Admitting: Internal Medicine

## 2021-12-03 VITALS — BP 148/90 | HR 72 | Resp 17 | Ht 72.0 in | Wt 193.0 lb

## 2021-12-03 DIAGNOSIS — M109 Gout, unspecified: Secondary | ICD-10-CM

## 2021-12-03 DIAGNOSIS — R7982 Elevated C-reactive protein (CRP): Secondary | ICD-10-CM

## 2021-12-04 ENCOUNTER — Telehealth: Payer: Self-pay | Admitting: *Deleted

## 2021-12-04 DIAGNOSIS — Z79899 Other long term (current) drug therapy: Secondary | ICD-10-CM

## 2021-12-04 LAB — URIC ACID: Uric Acid, Serum: 6.4 mg/dL (ref 4.0–8.0)

## 2021-12-04 LAB — C-REACTIVE PROTEIN: CRP: 5.2 mg/L (ref <8.0)

## 2021-12-04 MED ORDER — ALLOPURINOL 100 MG PO TABS: 100.0000 mg | ORAL_TABLET | Freq: Every day | ORAL | 1 refills | Status: DC

## 2021-12-04 NOTE — Progress Notes (Signed)
Uric acid is 6.4 this remains just mildly above goal I think we can start with allopurinol 100 mg 1 tablet daily. I will send a prescription to the pharmacy for this and we can recheck his labs in 1 month after starting.

## 2021-12-04 NOTE — Telephone Encounter (Signed)
-----   Message from Collier Salina, MD sent at 12/04/2021 10:34 AM EST ----- Uric acid is 6.4 this remains just mildly above goal I think we can start with allopurinol 100 mg 1 tablet daily. I will send a prescription to the pharmacy for this and we can recheck his labs in 1 month after starting.

## 2021-12-04 NOTE — Addendum Note (Signed)
Addended by: Collier Salina on: 12/04/2021 10:34 AM   Modules accepted: Orders

## 2021-12-04 NOTE — Progress Notes (Signed)
CRP is also normal now that he is not in the middle of an episode.

## 2021-12-23 ENCOUNTER — Ambulatory Visit: Payer: Medicare PPO | Admitting: Internal Medicine

## 2021-12-27 ENCOUNTER — Other Ambulatory Visit: Payer: Self-pay | Admitting: Internal Medicine

## 2021-12-27 DIAGNOSIS — M109 Gout, unspecified: Secondary | ICD-10-CM

## 2022-01-04 NOTE — Progress Notes (Deleted)
Office Visit Note  Patient: Matthew Kidd             Date of Birth: 10/30/1956           MRN: [id number]             PCP: Shirline Frees, MD Referring: Shirline Frees, MD Visit Date: 01/05/2022   Subjective:  No chief complaint on file.   History of Present Illness: Matthew Kidd is a 66 y.o. male here for follow up for left great toe arthritis apparently gouty arthritis after starting allopurinol 100 mg daily and PRN colchicine.***   Previous HPI 12/03/21 [name] is a 66 y.o. male here for evaluation with left MTP pain and swelling.  He has had a total of 4 episodes during the past year more recent ones have been less severe due to improving with medication treatment.  This was initially suspected as gout and improved after local steroid injection and oral methylprednisolone taper.  Subsequent episodes have improved with colchicine though he has a lot of diarrhea when taking the medication which is also caused some exacerbation of hemorrhoids and other GI complaints. He has no prior history of gout before this past year.  No known family history of gout or other inflammatory joint diseases.  He has never had a kidney stone.  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  He had a history of multiple venous ablation procedures on the left leg.  Otherwise no history of blood clots no history of coronary or peripheral arterial disease. Labs demonstrated CRP elevation at 115.5 otherwise unremarkable with a normal uric acid at the time of 6.1.  X-ray demonstrated moderately advanced osteoarthritis but also possible well-circumscribed erosive change at the first proximal phalanx.   No Rheumatology ROS completed.   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   Migraine 10/08/2021   Pure hypercholesterolemia 10/08/2021   History of adenomatous polyp of colon 01/15/2020    Past Medical History:  Diagnosis Date   Hypertension    SCCA (squamous cell carcinoma) of skin 12/23/2020   Right Preauricular Area (in situ)    Family History  Problem Relation Age of Onset   Cancer Mother    Cancer Father    Hypertension Father    Stroke Father    Past Surgical History:  Procedure Laterality Date   HERNIA REPAIR     LYMPHANGIOMA EXCISION     VARICOSE VEIN SURGERY     Social History   Social History Narrative   Not on file   Immunization History  Administered Date(s) Administered   Influenza Split 11/17/2009, 08/11/2010, 08/18/2011, 09/03/2014   Influenza,inj,Quad PF,6+ Mos 07/22/2017, 08/04/2018   Influenza,inj,quad, With Preservative 10/19/2013, 07/23/2016   Influenza-Unspecified 07/28/2015   MMR 01/24/2007   PFIZER(Purple Top)SARS-COV-2 Vaccination 12/02/2019, 01/27/2020   Pfizer Covid-19 Vaccine Bivalent Booster 58yr & up 08/31/2021   Tdap 08/17/2013     Objective: Vital Signs: There were no vitals taken for this visit.   Physical Exam   Musculoskeletal Exam: ***  CDAI Exam: CDAI Score: -- Patient Global: --; Provider Global: -- Swollen: --; Tender: -- Joint Exam 01/05/2022  No joint exam has been documented for this visit   There is currently no information documented on the homunculus. Go to the Rheumatology activity and complete the homunculus joint exam.  Investigation: No additional findings.  Imaging: No results found.  Recent Labs: Lab Results  Component Value Date   WBC 10.5 09/25/2021   HGB 15.5 09/25/2021   PLT 197 09/25/2021    Speciality Comments: No specialty comments available.  Procedures:  No procedures performed Allergies: Codeine   Assessment / Plan:      Visit Diagnoses: No diagnosis found.  ***  Orders: No orders of the defined types were placed in this encounter.  No orders of the defined types were placed in this encounter.    Follow-Up Instructions: No follow-ups on file.   Collier Salina, MD  Note - This record has been created using [employer].  Chart creation errors have been sought, but may not always  have been located. Such creation errors do not reflect on  the standard of medical care.

## 2022-01-05 ENCOUNTER — Ambulatory Visit: Payer: Medicare PPO | Admitting: Internal Medicine

## 2022-01-10 NOTE — Progress Notes (Signed)
Office Visit Note  Patient: Matthew Kidd             Date of Birth: 1956/06/14           MRN: [id number]             PCP: Shirline Frees, MD Referring: Shirline Frees, MD Visit Date: 01/11/2022   Subjective:  Follow-up (Doing good)   History of Present Illness: Matthew Kidd is a 66 y.o. 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 He is doing well without severe disease flare since our last visit. He takes the colchicine PRN when noticing any early signs starting up. The red and purple discolorations on his toe cleared up. He still has chronic 1st MTP stiffness.  Previous HPI 12/03/21 Matthew Kidd is a 66 y.o. male here for evaluation with left MTP pain and swelling.  He has had a total of 4 episodes during the past year more recent ones have been less severe due to improving with medication treatment.  This was initially suspected as gout and improved after local steroid injection and oral methylprednisolone taper.  Subsequent episodes have improved with colchicine though he has a lot of diarrhea when taking the medication which is also caused some exacerbation of hemorrhoids and other GI complaints. He has no prior history of gout before this past year.  No known family history of gout or other inflammatory joint diseases.  He has never had a kidney stone.  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  He had a history of multiple venous ablation procedures on the left leg.  Otherwise no history of blood clots no history of coronary or peripheral arterial disease. Labs demonstrated CRP elevation at 115.5 otherwise unremarkable with a normal uric acid at the time of 6.1.  X-ray demonstrated moderately advanced osteoarthritis but also possible well-circumscribed erosive change at the first proximal  phalanx.   Review of Systems  Constitutional:  Negative for fatigue.  HENT:  Negative for mouth dryness.   Eyes:  Negative for dryness.  Respiratory:  Negative for shortness of breath.   Cardiovascular:  Negative for swelling in legs/feet.  Gastrointestinal:  Negative for constipation.  Endocrine: Negative for excessive thirst.  Genitourinary:  Negative for difficulty urinating.  Musculoskeletal:  Positive for joint pain, joint pain and morning stiffness.  Skin:  Negative for rash.  Allergic/Immunologic: Negative for susceptible to infections.  Neurological:  Negative for numbness.  Hematological:  Negative for bruising/bleeding tendency.  Psychiatric/Behavioral:  Negative for sleep disturbance.    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   Migraine 10/08/2021   Pure hypercholesterolemia 10/08/2021   History of adenomatous polyp of colon 01/15/2020    Past Medical History:  Diagnosis Date   Hypertension    SCCA (squamous cell carcinoma) of skin 12/23/2020   Right Preauricular Area (in situ)    Family History  Problem Relation Age of Onset   Cancer Mother    Cancer Father    Hypertension Father    Stroke  Father    Past Surgical History:  Procedure Laterality Date   CATARACT EXTRACTION Bilateral    HAND SURGERY     HERNIA REPAIR     LYMPHANGIOMA EXCISION     TONSILLECTOMY     VARICOSE VEIN SURGERY     Social History   Social History Narrative   Not on file   Immunization History  Administered Date(s) Administered   Influenza Split 11/17/2009, 08/11/2010, 08/18/2011, 09/03/2014   Influenza,inj,Quad PF,6+ Mos 07/22/2017, 08/04/2018   Influenza,inj,quad,  With Preservative 10/19/2013, 07/23/2016   Influenza-Unspecified 07/28/2015   MMR 01/24/2007   PFIZER(Purple Top)SARS-COV-2 Vaccination 12/02/2019, 01/27/2020   Pfizer Covid-19 Vaccine Bivalent Booster 43yr & up 08/31/2021   Tdap 08/17/2013     Objective: Vital Signs: BP (!) 157/90 (BP Location: Left Arm, Patient Position: Sitting, Cuff Size: Normal)    Pulse 67    Resp 15    Ht 6' (1.829 m)    Wt 194 lb (88 kg)    BMI 26.31 kg/m    Physical Exam Musculoskeletal:     Right lower leg: No edema.     Left lower leg: No edema.  Skin:    General: Skin is warm and dry.     Findings: No rash.  Neurological:     Mental Status: He is alert.  Psychiatric:        Mood and Affect: Mood normal.     Musculoskeletal Exam:  Elbows full ROM no tenderness or swelling Wrists full ROM no tenderness or swelling Fingers full ROM no tenderness or swelling Knees full ROM no tenderness or swelling Ankles full ROM no tenderness or swelling 1st MTP mildly decreased flexion and extension ROM, no swelling or erythema   Investigation: No additional findings.  Imaging: No results found.  Recent Labs: Lab Results  Component Value Date   WBC 10.5 09/25/2021   HGB 15.5 09/25/2021   PLT 197 09/25/2021    Speciality Comments: No specialty comments available.  Procedures:  No procedures performed Allergies: Codeine   Assessment / Plan:     Visit Diagnoses: Podagra - Plan: Uric acid, Colchicine (MITIGARE) 0.6 MG CAPS, allopurinol (ZYLOPRIM) 100 MG tablet  Gout appears to be well controlled now. Rechecking uric acid I suspect he will be at goal on allopurinol 100 mg daily given low previous level. If so will continue current dose if above increase to 200 mg. Continue PRN colchicine. Can return in 1 yr or PRN.  Orders: Orders Placed This Encounter  Procedures   Uric acid   Meds ordered this encounter  Medications   Colchicine (MITIGARE) 0.6 MG CAPS    Sig: Take as discussed up to TID or  GI intolerance    Dispense:  30 capsule    Refill:  1   allopurinol (ZYLOPRIM) 100 MG tablet    Sig: Take 1 tablet (100 mg total) by mouth daily.    Dispense:  90 tablet    Refill:  3     Follow-Up Instructions: Return in about 1 year (around 01/11/2023) for Gout on allopurinol/PRN colchicine f/u 113yr  ChCollier SalinaMD  Note - This record has been created using [employer] Chart creation errors have been sought, but may not always  have been located. Such creation errors do not reflect on  the standard of medical care.

## 2022-01-11 ENCOUNTER — Encounter: Payer: Self-pay | Admitting: Podiatry

## 2022-01-11 ENCOUNTER — Other Ambulatory Visit: Payer: Self-pay

## 2022-01-11 ENCOUNTER — Encounter: Payer: Self-pay | Admitting: Internal Medicine

## 2022-01-11 ENCOUNTER — Ambulatory Visit (INDEPENDENT_AMBULATORY_CARE_PROVIDER_SITE_OTHER): Payer: Medicare PPO | Admitting: Internal Medicine

## 2022-01-11 ENCOUNTER — Ambulatory Visit: Payer: Medicare PPO | Admitting: Podiatry

## 2022-01-11 VITALS — BP 157/90 | HR 67 | Resp 15 | Ht 72.0 in | Wt 194.0 lb

## 2022-01-11 DIAGNOSIS — M109 Gout, unspecified: Secondary | ICD-10-CM

## 2022-01-11 DIAGNOSIS — L03031 Cellulitis of right toe: Secondary | ICD-10-CM | POA: Diagnosis not present

## 2022-01-11 LAB — URIC ACID: Uric Acid, Serum: 5.6 mg/dL (ref 4.0–8.0)

## 2022-01-11 MED ORDER — COLCHICINE 0.6 MG PO CAPS: ORAL_CAPSULE | ORAL | 1 refills | Status: AC

## 2022-01-11 MED ORDER — DOXYCYCLINE HYCLATE 100 MG PO TABS: 100.0000 mg | ORAL_TABLET | Freq: Two times a day (BID) | ORAL | 1 refills | Status: DC

## 2022-01-11 MED ORDER — ALLOPURINOL 100 MG PO TABS: 100.0000 mg | ORAL_TABLET | Freq: Every day | ORAL | 3 refills | Status: DC

## 2022-01-11 NOTE — Progress Notes (Signed)
He presents today for follow-up of his matrixectomy.  He states that he still little tender on the side as he points to the fibular border of the right hallux.  Objective: Vital signs are stable oriented x3 his fibular margin is moderately red there is no purulence no malodor I did trim some debris from the margin.  Assessment: Mild paronychia status post matrixectomy.  Plan: We will start him on doxycycline to see if this will help resolve the tenderness and the erythema

## 2022-01-12 NOTE — Progress Notes (Signed)
Uric is 5.6 which is at goal, improved from 6.4. He can continue the current allopurinol 100 mg daily as planned.

## 2022-02-16 DIAGNOSIS — E7800 Pure hypercholesterolemia, unspecified: Secondary | ICD-10-CM | POA: Diagnosis not present

## 2022-02-16 DIAGNOSIS — N486 Induration penis plastica: Secondary | ICD-10-CM | POA: Diagnosis not present

## 2022-02-16 DIAGNOSIS — I10 Essential (primary) hypertension: Secondary | ICD-10-CM | POA: Diagnosis not present

## 2022-02-16 DIAGNOSIS — M109 Gout, unspecified: Secondary | ICD-10-CM | POA: Diagnosis not present

## 2022-03-02 ENCOUNTER — Ambulatory Visit: Payer: Medicare PPO | Admitting: Dermatology

## 2022-03-02 DIAGNOSIS — D369 Benign neoplasm, unspecified site: Secondary | ICD-10-CM

## 2022-03-02 DIAGNOSIS — L918 Other hypertrophic disorders of the skin: Secondary | ICD-10-CM

## 2022-03-02 DIAGNOSIS — B078 Other viral warts: Secondary | ICD-10-CM | POA: Diagnosis not present

## 2022-03-02 DIAGNOSIS — L570 Actinic keratosis: Secondary | ICD-10-CM

## 2022-03-02 DIAGNOSIS — L729 Follicular cyst of the skin and subcutaneous tissue, unspecified: Secondary | ICD-10-CM | POA: Diagnosis not present

## 2022-03-02 DIAGNOSIS — D1801 Hemangioma of skin and subcutaneous tissue: Secondary | ICD-10-CM

## 2022-03-02 DIAGNOSIS — Z85828 Personal history of other malignant neoplasm of skin: Secondary | ICD-10-CM

## 2022-03-02 DIAGNOSIS — D235 Other benign neoplasm of skin of trunk: Secondary | ICD-10-CM

## 2022-03-02 DIAGNOSIS — L821 Other seborrheic keratosis: Secondary | ICD-10-CM

## 2022-03-02 DIAGNOSIS — Z1283 Encounter for screening for malignant neoplasm of skin: Secondary | ICD-10-CM

## 2022-03-02 DIAGNOSIS — R238 Other skin changes: Secondary | ICD-10-CM

## 2022-03-23 ENCOUNTER — Encounter: Payer: Self-pay | Admitting: Dermatology

## 2022-03-23 NOTE — Progress Notes (Signed)
? ?  Follow-Up Visit ?  ?Subjective  ?Pedrohenrique Mcconville is a 66 y.o. male who presents for the following: Annual Exam (Here for yearly skin exam. Concerns top of ears/eyebrows. Back of neck. New lesion on right groin crease. X months. No itch but it kinda looks like blister. History of non mole skin cancers. Wants eye referral for cyst.  ). ? ?General skin examination, several spots of concern depression ?Location:  ?Duration:  ?Quality:  ?Associated Signs/Symptoms: ?Modifying Factors:  ?Severity:  ?Timing: ?Context:  ? ?Objective  ?Well appearing patient in no apparent distress; mood and affect are within normal limits. ?Full body exam: No atypical pigmented lesions or nonmelanoma skin cancer.  All pigmented lesions checked with dermoscopy. ? ?Left Dorsal Hand (2), Mid Supratip of Nose, Right Malar Cheek ?Gritty to hornlike 2 to 4 mm pink crusts ? ?Right 2nd Finger Metacarpophalangeal Joint ?Verrucous 4 mm flesh-colored papule ? ?Right Abdomen (side) - Upper ?1 mm smooth red dermal papules ? ?Left Supraorbital Region ?Pedunculated 2 mm flesh-colored papule ? ?Left Malar Cheek ?Open cyst ? ?Right Malar Cheek ?Soft for millimeter blue dermal papule that partly compresses ? ?Left Flank ?Textured 6 mm light brown flattopped papule ? ?Right Inguinal Area ?4 mm flesh-colored pedunculated papule ? ? ? ?A full examination was performed including scalp, head, eyes, ears, nose, lips, neck, chest, axillae, abdomen, back, buttocks, bilateral upper extremities, bilateral lower extremities, hands, feet, fingers, toes, fingernails, and toenails. All findings within normal limits unless otherwise noted below. ? ? ?Assessment & Plan  ? ? ?Encounter for screening for malignant neoplasm of skin ? ?Annual skin examination.  Encouraged to self examine with spouse twice annually.  Continue ultraviolet protection. ? ?AK (actinic keratosis) (4) ?Left Dorsal Hand (2); Mid Supratip of Nose; Right Malar Cheek ? ?Destruction of lesion - Left  Dorsal Hand (2), Mid Supratip of Nose, Right Malar Cheek ?Complexity: simple   ?Destruction method: cryotherapy   ?Informed consent: discussed and consent obtained   ?Timeout:  patient name, date of birth, surgical site, and procedure verified ?Lesion destroyed using liquid nitrogen: Yes   ?Cryotherapy cycles:  3 ?Outcome: patient tolerated procedure well with no complications   ?Post-procedure details: wound care instructions given   ? ?Other viral warts ?Right 2nd Finger Metacarpophalangeal Joint ? ?No intervention initiated ? ?Cherry angioma ?Right Abdomen (side) - Upper ? ?No intervention indicated ? ?Skin tag ?Left Supraorbital Region ? ?May schedule time to remove ? ?Cyst of skin ?Left Malar Cheek ? ?Venous [place] ?Right Malar Cheek ? ?Leave if stable ? ?Seborrheic keratosis ?Left Flank ? ?Leave if stable ? ?Papilloma ?Right Inguinal Area ? ?May choose to have removed in future ? ? ? ? ? ?I, Lavonna Monarch, MD, have reviewed all documentation for this visit.  The documentation on 03/23/22 for the exam, diagnosis, procedures, and orders are all accurate and complete. ?

## 2022-03-24 DIAGNOSIS — H43813 Vitreous degeneration, bilateral: Secondary | ICD-10-CM | POA: Diagnosis not present

## 2022-04-08 ENCOUNTER — Ambulatory Visit: Payer: Medicare PPO | Admitting: Dermatology

## 2022-04-08 DIAGNOSIS — D22111 Melanocytic nevi of right upper eyelid, including canthus: Secondary | ICD-10-CM | POA: Diagnosis not present

## 2022-04-08 DIAGNOSIS — D485 Neoplasm of uncertain behavior of skin: Secondary | ICD-10-CM

## 2022-04-08 DIAGNOSIS — D229 Melanocytic nevi, unspecified: Secondary | ICD-10-CM

## 2022-04-08 DIAGNOSIS — L918 Other hypertrophic disorders of the skin: Secondary | ICD-10-CM | POA: Diagnosis not present

## 2022-04-08 DIAGNOSIS — D23121 Other benign neoplasm of skin of left upper eyelid, including canthus: Secondary | ICD-10-CM | POA: Diagnosis not present

## 2022-04-08 NOTE — Patient Instructions (Signed)

## 2022-04-25 ENCOUNTER — Encounter: Payer: Self-pay | Admitting: Dermatology

## 2022-04-25 NOTE — Progress Notes (Signed)
   Follow-Up Visit   Subjective  Matthew Kidd is a 66 y.o. male who presents for the following: Procedure (Here for growth on left eyelid. ).  Growth on left eyelid +1 on right Location:  Duration:  Quality:  Associated Signs/Symptoms: Modifying Factors:  Severity:  Timing: Context:   Objective  Well appearing patient in no apparent distress; mood and affect are within normal limits. Left Upper Eyelid 5 mm verrucous polypoid papule eyelid, has historically grown.  Right Upper Eyelid 3 mm smooth flesh-colored papule, historically stable    A focused examination was performed including head and neck. Relevant physical exam findings are noted in the Assessment and Plan.   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 Anesthesia: the lesion was anesthetized in a standard fashion   Anesthetic:  1% lidocaine w/ epinephrine 1-100,000 buffered w/ 8.4% NaHCO3 Instrument used: flexible razor blade   Hemostasis achieved with: pressure, aluminum chloride and electrodesiccation   Outcome: patient tolerated procedure well   Post-procedure details: wound care instructions given   Post-procedure details comment:  Ointment and small bandage applied  Specimen 1 - Surgical pathology Differential Diagnosis: Skin tag vs other  Check Margins: No  Nevus Right Upper Eyelid  Leave if stable      I, Lavonna Monarch, MD, have reviewed all documentation for this visit.  The documentation on 04/25/22 for the exam, diagnosis, procedures, and orders are all accurate and complete.

## 2022-07-15 DIAGNOSIS — I10 Essential (primary) hypertension: Secondary | ICD-10-CM | POA: Diagnosis not present

## 2022-07-15 DIAGNOSIS — B354 Tinea corporis: Secondary | ICD-10-CM | POA: Diagnosis not present

## 2022-08-17 DIAGNOSIS — B354 Tinea corporis: Secondary | ICD-10-CM | POA: Diagnosis not present

## 2022-08-25 DIAGNOSIS — H6122 Impacted cerumen, left ear: Secondary | ICD-10-CM | POA: Diagnosis not present

## 2022-08-25 DIAGNOSIS — I10 Essential (primary) hypertension: Secondary | ICD-10-CM | POA: Diagnosis not present

## 2022-08-25 DIAGNOSIS — E7800 Pure hypercholesterolemia, unspecified: Secondary | ICD-10-CM | POA: Diagnosis not present

## 2022-08-25 DIAGNOSIS — M109 Gout, unspecified: Secondary | ICD-10-CM | POA: Diagnosis not present

## 2022-08-25 DIAGNOSIS — Z125 Encounter for screening for malignant neoplasm of prostate: Secondary | ICD-10-CM | POA: Diagnosis not present

## 2022-09-08 ENCOUNTER — Telehealth: Payer: Self-pay

## 2022-09-08 NOTE — Patient Outreach (Signed)
  Care Coordination   09/08/2022 Name: Matthew Kidd MRN: [id number] DOB: 1956/06/30   Care Coordination Outreach Attempts:  An unsuccessful telephone outreach was attempted today to offer the patient information about available care coordination services as a benefit of their health plan.   Follow Up Plan:  Additional outreach attempts will be made to offer the patient care coordination information and services.   Encounter Outcome:  No Answer  Care Coordination Interventions Activated:  No   Care Coordination Interventions:  No, not indicated    Jone Baseman, RN, MSN [facility] Care Management Care Management Coordinator Direct Line [phone]

## 2022-09-23 ENCOUNTER — Telehealth: Payer: Self-pay

## 2022-09-23 NOTE — Patient Outreach (Signed)
  Care Coordination   09/23/2022 Name: Matthew Kidd MRN: [id number] DOB: 06-06-1956   Care Coordination Outreach Attempts:  A second unsuccessful outreach was attempted today to offer the patient with information about available care coordination services as a benefit of their health plan.     Follow Up Plan:  Additional outreach attempts will be made to offer the patient care coordination information and services.   Encounter Outcome:  No Answer  Care Coordination Interventions Activated:  No   Care Coordination Interventions:  No, not indicated    Jone Baseman, RN, MSN [facility] Care Management Care Management Coordinator Direct Line [phone]

## 2022-10-11 ENCOUNTER — Telehealth: Payer: Self-pay

## 2022-10-11 NOTE — Patient Outreach (Signed)
  Care Coordination   10/11/2022 Name: Matthew Kidd MRN: [id number] DOB: 1956-05-04   Care Coordination Outreach Attempts:  A third unsuccessful outreach was attempted today to offer the patient with information about available care coordination services as a benefit of their health plan.   Follow Up Plan:  No further outreach attempts will be made at this time. We have been unable to contact the patient to offer or enroll patient in care coordination services  Encounter Outcome:  No Answer  Care Coordination Interventions Activated:  No   Care Coordination Interventions:  No, not indicated    Jone Baseman, RN, MSN [place] Management Care Management Coordinator Direct Line [phone]

## 2023-01-10 NOTE — Progress Notes (Unsigned)
Office Visit Note  Patient: Matthew Kidd             Date of Birth: [birth year]           MRN: [id number]             PCP: Shirline Frees, MD Referring: Shirline Frees, MD Visit Date: 01/11/2023   Subjective:  No chief complaint on file.   History of Present Illness: Sahand Sposito is a 67 y.o. male here for follow up ***   Previous HPI 01/11/22 Knut Zamani is a 67 y.o. 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 He is doing well without severe disease flare since our last visit. He takes the colchicine PRN when noticing any early signs starting up. The red and purple discolorations on his toe cleared up. He still has chronic 1st MTP stiffness.   Previous HPI 12/03/21 Liberty Netherland is a 67 y.o. male here for evaluation with left MTP pain and swelling.  He has had a total of 4 episodes during the past year more recent ones have been less severe due to improving with medication treatment.  This was initially suspected as gout and improved after local steroid injection and oral methylprednisolone taper.  Subsequent episodes have improved with colchicine though he has a lot of diarrhea when taking the medication which is also caused some exacerbation of hemorrhoids and other GI complaints. He has no prior history of gout before this past year.  No known family history of gout or other inflammatory joint diseases.  He has never had a kidney stone.  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  He had a history of multiple venous ablation procedures on the left leg.  Otherwise no history of blood clots no history of coronary or peripheral arterial disease. Labs demonstrated CRP elevation at 115.5 otherwise unremarkable with a normal uric acid at the time of 6.1.  X-ray demonstrated moderately advanced  osteoarthritis but also possible well-circumscribed erosive change at the first proximal phalanx.   No Rheumatology ROS completed.   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   Major depression single episode, in partial remission (Experiment) 10/08/2021   Migraine 10/08/2021   Pure hypercholesterolemia 10/08/2021   History of adenomatous polyp of colon 01/15/2020    Past Medical History:  Diagnosis Date   Hypertension    SCCA (squamous cell carcinoma) of skin 12/23/2020   Right Preauricular Area (in situ)    Family History  Problem Relation Age of Onset   Cancer Mother    Cancer Father    Hypertension Father    Stroke Father    Past Surgical History:  Procedure Laterality Date   CATARACT EXTRACTION Bilateral    HAND SURGERY     HERNIA REPAIR     LYMPHANGIOMA EXCISION     TONSILLECTOMY     VARICOSE VEIN SURGERY     Social History   Social History Narrative   Not on file   Immunization History  Administered Date(s) Administered   Influenza Split 11/17/2009, 08/11/2010, 08/18/2011, 09/03/2014   Influenza,inj,Quad PF,6+  Mos 07/22/2017, 08/04/2018   Influenza,inj,quad, With Preservative 10/19/2013, 07/23/2016   Influenza-Unspecified 07/28/2015   MMR 01/24/2007   PFIZER(Purple Top)SARS-COV-2 Vaccination 12/02/2019, 01/27/2020   Pfizer Covid-19 Vaccine Bivalent Booster 59yr & up 08/31/2021   Tdap 08/17/2013     Objective: Vital Signs: There were no vitals taken for this visit.   Physical Exam   Musculoskeletal Exam: ***  CDAI Exam: CDAI Score: -- Patient Global: --; Provider Global: -- Swollen: --; Tender: -- Joint Exam 01/11/2023   No joint exam has been documented for this visit   There is currently no information documented  on the homunculus. Go to the Rheumatology activity and complete the homunculus joint exam.  Investigation: No additional findings.  Imaging: No results found.  Recent Labs: Lab Results  Component Value Date   WBC 10.5 09/25/2021   HGB 15.5 09/25/2021   PLT 197 09/25/2021    Speciality Comments: No specialty comments available.  Procedures:  No procedures performed Allergies: Codeine   Assessment / Plan:     Visit Diagnoses: No diagnosis found.  ***  Orders: No orders of the defined types were placed in this encounter.  No orders of the defined types were placed in this encounter.    Follow-Up Instructions: No follow-ups on file.   CCollier Salina MD  Note - This record has been created using [employer]  Chart creation errors have been sought, but may not always  have been located. Such creation errors do not reflect on  the standard of medical care.

## 2023-01-11 ENCOUNTER — Ambulatory Visit: Payer: Medicare PPO | Attending: Internal Medicine | Admitting: Internal Medicine

## 2023-01-11 ENCOUNTER — Encounter: Payer: Self-pay | Admitting: Internal Medicine

## 2023-01-11 VITALS — BP 126/81 | HR 60 | Resp 14 | Ht 72.0 in | Wt 190.0 lb

## 2023-01-11 DIAGNOSIS — M25512 Pain in left shoulder: Secondary | ICD-10-CM | POA: Insufficient documentation

## 2023-01-11 DIAGNOSIS — M109 Gout, unspecified: Secondary | ICD-10-CM | POA: Diagnosis not present

## 2023-01-11 DIAGNOSIS — G8929 Other chronic pain: Secondary | ICD-10-CM | POA: Diagnosis not present

## 2023-01-11 MED ORDER — ALLOPURINOL 100 MG PO TABS: 100.0000 mg | ORAL_TABLET | Freq: Every day | ORAL | 3 refills | Status: DC

## 2023-01-12 LAB — URIC ACID: Uric Acid, Serum: 5.1 mg/dL (ref 4.0–8.0)

## 2023-01-12 NOTE — Progress Notes (Signed)
Uric acid is 5.1 which is at goal, down further from 5.6 last year. No change in medication needed.

## 2023-02-24 DIAGNOSIS — M109 Gout, unspecified: Secondary | ICD-10-CM | POA: Diagnosis not present

## 2023-02-24 DIAGNOSIS — I10 Essential (primary) hypertension: Secondary | ICD-10-CM | POA: Diagnosis not present

## 2023-02-24 DIAGNOSIS — E7800 Pure hypercholesterolemia, unspecified: Secondary | ICD-10-CM | POA: Diagnosis not present

## 2023-03-24 DIAGNOSIS — R1032 Left lower quadrant pain: Secondary | ICD-10-CM | POA: Diagnosis not present

## 2023-03-25 ENCOUNTER — Other Ambulatory Visit: Payer: Self-pay | Admitting: Physician Assistant

## 2023-03-25 DIAGNOSIS — R1032 Left lower quadrant pain: Secondary | ICD-10-CM

## 2023-04-12 ENCOUNTER — Ambulatory Visit: Payer: Medicare PPO | Admitting: Dermatology

## 2023-04-29 ENCOUNTER — Ambulatory Visit
Admission: RE | Admit: 2023-04-29 | Discharge: 2023-04-29 | Disposition: A | Discharge status: Home / Self Care | Payer: Medicare PPO | Source: Ambulatory Visit | Attending: Physician Assistant | Admitting: Physician Assistant

## 2023-04-29 DIAGNOSIS — K8020 Calculus of gallbladder without cholecystitis without obstruction: Secondary | ICD-10-CM | POA: Diagnosis not present

## 2023-04-29 DIAGNOSIS — R1032 Left lower quadrant pain: Secondary | ICD-10-CM

## 2023-04-29 DIAGNOSIS — K5790 Diverticulosis of intestine, part unspecified, without perforation or abscess without bleeding: Secondary | ICD-10-CM | POA: Diagnosis not present

## 2023-04-29 DIAGNOSIS — R1084 Generalized abdominal pain: Secondary | ICD-10-CM | POA: Diagnosis not present

## 2023-04-29 MED ORDER — IOPAMIDOL (ISOVUE-300) INJECTION 61%
100.0000 mL | Freq: Once | INTRAVENOUS | Status: AC | PRN
  Administered 2023-04-29: 100 mL via INTRAVENOUS

## 2023-05-16 DIAGNOSIS — R1032 Left lower quadrant pain: Secondary | ICD-10-CM | POA: Diagnosis not present

## 2023-06-17 DIAGNOSIS — W57XXXA Bitten or stung by nonvenomous insect and other nonvenomous arthropods, initial encounter: Secondary | ICD-10-CM | POA: Diagnosis not present

## 2023-06-17 DIAGNOSIS — S80861A Insect bite (nonvenomous), right lower leg, initial encounter: Secondary | ICD-10-CM | POA: Diagnosis not present

## 2023-06-17 DIAGNOSIS — S80862A Insect bite (nonvenomous), left lower leg, initial encounter: Secondary | ICD-10-CM | POA: Diagnosis not present

## 2023-09-02 DIAGNOSIS — B880 Other acariasis: Secondary | ICD-10-CM | POA: Diagnosis not present

## 2023-09-02 DIAGNOSIS — Z125 Encounter for screening for malignant neoplasm of prostate: Secondary | ICD-10-CM | POA: Diagnosis not present

## 2023-09-02 DIAGNOSIS — M109 Gout, unspecified: Secondary | ICD-10-CM | POA: Diagnosis not present

## 2023-09-02 DIAGNOSIS — E7800 Pure hypercholesterolemia, unspecified: Secondary | ICD-10-CM | POA: Diagnosis not present

## 2023-09-02 DIAGNOSIS — I10 Essential (primary) hypertension: Secondary | ICD-10-CM | POA: Diagnosis not present

## 2023-11-11 ENCOUNTER — Other Ambulatory Visit: Payer: Self-pay | Admitting: Internal Medicine

## 2023-11-11 DIAGNOSIS — M109 Gout, unspecified: Secondary | ICD-10-CM

## 2023-11-11 NOTE — Telephone Encounter (Signed)
Last Fill: 01/11/2023  Labs: 01/11/2023 Uric Acid 5.1  Next Visit: 01/17/2024  Last Visit: 01/11/2023  DX: Podagra   Current Dose per office note 01/11/2023: allopurinol 100 mg daily.   Okay to refill Allopurinol? Do you want patient to come have labs updated?

## 2023-12-22 DIAGNOSIS — Z1211 Encounter for screening for malignant neoplasm of colon: Secondary | ICD-10-CM | POA: Diagnosis not present

## 2023-12-22 DIAGNOSIS — K5730 Diverticulosis of large intestine without perforation or abscess without bleeding: Secondary | ICD-10-CM | POA: Diagnosis not present

## 2023-12-22 DIAGNOSIS — I10 Essential (primary) hypertension: Secondary | ICD-10-CM | POA: Diagnosis not present

## 2023-12-22 DIAGNOSIS — Z79899 Other long term (current) drug therapy: Secondary | ICD-10-CM | POA: Diagnosis not present

## 2023-12-22 DIAGNOSIS — D123 Benign neoplasm of transverse colon: Secondary | ICD-10-CM | POA: Diagnosis not present

## 2023-12-22 DIAGNOSIS — D124 Benign neoplasm of descending colon: Secondary | ICD-10-CM | POA: Diagnosis not present

## 2023-12-22 DIAGNOSIS — R1032 Left lower quadrant pain: Secondary | ICD-10-CM | POA: Diagnosis not present

## 2023-12-22 DIAGNOSIS — K648 Other hemorrhoids: Secondary | ICD-10-CM | POA: Diagnosis not present

## 2023-12-22 DIAGNOSIS — Z860101 Personal history of adenomatous and serrated colon polyps: Secondary | ICD-10-CM | POA: Diagnosis not present

## 2023-12-22 DIAGNOSIS — K635 Polyp of colon: Secondary | ICD-10-CM | POA: Diagnosis not present

## 2024-01-03 NOTE — Progress Notes (Signed)
 Office Visit Note  Patient: Matthew Kidd             Date of Birth: 1955-12-17           MRN: [id number]             PCP: Noberto Retort, MD Referring: Noberto Retort, MD Visit Date: 01/17/2024   Subjective:  Follow-up (Patient states he recently had a coloscopy. )   History of Present Illness: Matthew Kidd is a 68 y.o. male here for follow up for gout on allopurinol 100 mg daily.  Gout has been doing well the entire past year he does not report any significant flareup of podagra with his classic gout symptoms.  He does occasionally get pain in the feet particular around the ankle and in the first MTP joint usually without much swelling.  Does have decreased range of motion the joints.  Currently has bruising left ankle which is from accidentally striking it.  No other significant medication aeromedical status changes.   Previous HPI 01/11/2023 Matthew Kidd is a 68 y.o. male here for follow up for gout on allopurinol 100 mg daily.  He has not had any recurrence of podagra since her last visit and not required any colchicine treatment.  Apparently stopped hydrochlorothiazide treatment with his PCP without any worsening of blood pressure and has improved his amount of urinary frequency.  He still gets some joint pain in other areas and some osteoarthritis at the great toe.  More recently he is into some issue with pain at the left shoulder frequently bothers him lying in bed at night very often tries to sleep on his stomach with his arm in a raised position on his side due to snoring when supine.  Pain is not constant and not bothering him today.   Previous HPI 01/11/22 Matthew Kidd is a 68 y.o. 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 He is doing well without severe disease flare since our last visit. He takes the colchicine PRN when  noticing any early signs starting up. The red and purple discolorations on his toe cleared up. He still has chronic 1st MTP stiffness.   Previous HPI 12/03/21 Matthew Kidd is a 68 y.o. male here for evaluation with left MTP pain and swelling.  He has had a total of 4 episodes during the past year more recent ones have been less severe due to improving with medication treatment.  This was initially suspected as gout and improved after local steroid injection and oral methylprednisolone taper.  Subsequent episodes have improved with colchicine though he has a lot of diarrhea when taking the medication which is also caused some exacerbation of hemorrhoids and other GI complaints. He has no prior history of gout before this past year.  No known family history of gout or other inflammatory joint diseases.  He has never had a kidney stone.  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  He had a history of multiple venous ablation procedures on the left leg.  Otherwise no history of blood clots no history of coronary or peripheral arterial disease. Labs demonstrated CRP elevation at 115.5 otherwise unremarkable with a normal uric acid at the time of 6.1.  X-ray demonstrated moderately advanced osteoarthritis but also possible well-circumscribed erosive change at the first  proximal phalanx.   Review of Systems  Constitutional:  Negative for fatigue.  HENT:  Negative for mouth sores and mouth dryness.   Eyes:  Negative for dryness.  Respiratory:  Negative for shortness of breath.   Cardiovascular:  Negative for chest pain and palpitations.  Gastrointestinal:  Negative for blood in stool, constipation and diarrhea.  Endocrine: Negative for increased urination.  Genitourinary:  Negative for involuntary urination.  Musculoskeletal:  Positive for joint pain and joint pain. Negative for gait problem, joint swelling, myalgias, muscle weakness, morning stiffness,  muscle tenderness and myalgias.  Skin:  Positive for sensitivity to sunlight. Negative for color change, rash and hair loss.  Allergic/Immunologic: Negative for susceptible to infections.  Neurological:  Negative for dizziness and headaches.  Hematological:  Negative for swollen glands.  Psychiatric/Behavioral:  Positive for sleep disturbance. Negative for depressed mood. The patient is not nervous/anxious.     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   Major depression single episode, in partial remission (HCC) 10/08/2021   Migraine 10/08/2021   Pure hypercholesterolemia 10/08/2021   History of adenomatous polyp of colon 01/15/2020    Past Medical History:  Diagnosis Date   Hypertension    SCCA (squamous cell carcinoma) of skin 12/23/2020   Right Preauricular Area (in situ)    Family History  Problem Relation Age of Onset   Cancer Mother    Cancer Father    Hypertension Father    Stroke Father    Past Surgical History:  Procedure Laterality Date   CATARACT EXTRACTION Bilateral    COLONOSCOPY     HAND SURGERY     HERNIA REPAIR     LYMPHANGIOMA EXCISION     TONSILLECTOMY     VARICOSE VEIN SURGERY     Social History   Social History Narrative   Not on file   Immunization History  Administered Date(s) Administered   Influenza Split 11/17/2009, 08/11/2010, 08/18/2011, 09/03/2014   Influenza,inj,Quad PF,6+ Mos 07/22/2017, 08/04/2018   Influenza,inj,quad, With Preservative 10/19/2013, 07/23/2016   Influenza-Unspecified 07/28/2015   MMR 01/24/2007   PFIZER(Purple Top)SARS-COV-2 Vaccination 12/02/2019, 01/27/2020   Pfizer Covid-19 Vaccine Bivalent Booster 48yrs & up 08/31/2021   Tdap 08/17/2013     Objective: Vital Signs: BP 119/75 (BP  Location: Left Arm, Patient Position: Sitting, Cuff Size: Large)   Pulse (!) 56   Resp 14   Ht 6' (1.829 m)   Wt 183 lb (83 kg)   BMI 24.82 kg/m    Physical Exam Cardiovascular:     Rate and Rhythm: Normal rate and regular rhythm.  Pulmonary:     Effort: Pulmonary effort is normal.     Breath sounds: Normal breath sounds.  Skin:    General: Skin is warm and dry.  Neurological:     Mental Status: He is alert.  Psychiatric:        Mood and Affect: Mood normal.      Musculoskeletal Exam:  Elbows full ROM no tenderness or swelling Wrists full ROM no tenderness or swelling Fingers full ROM no tenderness or swelling Knees full ROM no tenderness or swelling Left ankle decreased inversion/eversion range of motion, there is trace swelling on the medial side with bruising at the malleolus Left first MTP with bony nodules and decreased  flexion extension range of motion   Investigation: No additional findings.  Imaging: No results found.  Recent Labs: Lab Results  Component Value Date   WBC 10.5 09/25/2021   HGB 15.5 09/25/2021   PLT 197 09/25/2021    Speciality Comments: No specialty comments available.  Procedures:  No procedures performed Allergies: Codeine   Assessment / Plan:     Visit Diagnoses: Podagra - 01/11/2023 Uric Acid 5.1 - Plan: Uric acid, allopurinol (ZYLOPRIM) 100 MG tablet Gout appears very well-controlled related to flareups for over 1 year managed with diet and low-dose allopurinol.  No changes concerning for renal or cardiac disease likely to complicate disease on other records reviewed.  Will recheck serum uric acid level to ensure remaining below goal of 6.0 on current regimen.  If appropriate we will then continue allopurinol 100 mg once daily.  Orders: Orders Placed This Encounter  Procedures   Uric acid   Meds ordered this encounter  Medications   allopurinol (ZYLOPRIM) 100 MG tablet    Sig: Take 1 tablet (100 mg total) by mouth daily.     Dispense:  90 tablet    Refill:  3     Follow-Up Instructions: Return in about 1 year (around 01/16/2025) for Gout on allopurinol f/u 45yr.   Fuller Plan, MD  Note - This record has been created using [employer].  Chart creation errors have been sought, but may not always  have been located. Such creation errors do not reflect on  the standard of medical care.

## 2024-01-17 ENCOUNTER — Ambulatory Visit: Payer: Medicare PPO | Attending: Internal Medicine | Admitting: Internal Medicine

## 2024-01-17 ENCOUNTER — Encounter: Payer: Self-pay | Admitting: Internal Medicine

## 2024-01-17 VITALS — BP 119/75 | HR 56 | Resp 14 | Ht 72.0 in | Wt 183.0 lb

## 2024-01-17 DIAGNOSIS — M109 Gout, unspecified: Secondary | ICD-10-CM

## 2024-01-17 DIAGNOSIS — Z5181 Encounter for therapeutic drug level monitoring: Secondary | ICD-10-CM

## 2024-01-17 DIAGNOSIS — G8929 Other chronic pain: Secondary | ICD-10-CM

## 2024-01-17 MED ORDER — ALLOPURINOL 100 MG PO TABS
100.0000 mg | ORAL_TABLET | Freq: Every day | ORAL | 3 refills | Status: AC
Start: 2024-01-17 — End: ?

## 2024-01-18 LAB — URIC ACID: Uric Acid, Serum: 5.2 mg/dL (ref 4.0–8.0)

## 2024-01-18 NOTE — Progress Notes (Signed)
Uric acid of 5.2 is very well-controlled does not need any change in allopurinol dose.

## 2024-03-05 DIAGNOSIS — I10 Essential (primary) hypertension: Secondary | ICD-10-CM | POA: Diagnosis not present

## 2024-03-05 DIAGNOSIS — E7800 Pure hypercholesterolemia, unspecified: Secondary | ICD-10-CM | POA: Diagnosis not present

## 2024-03-05 DIAGNOSIS — M109 Gout, unspecified: Secondary | ICD-10-CM | POA: Diagnosis not present

## 2024-07-19 DIAGNOSIS — L728 Other follicular cysts of the skin and subcutaneous tissue: Secondary | ICD-10-CM | POA: Diagnosis not present

## 2024-07-19 DIAGNOSIS — L814 Other melanin hyperpigmentation: Secondary | ICD-10-CM | POA: Diagnosis not present

## 2024-07-19 DIAGNOSIS — L723 Sebaceous cyst: Secondary | ICD-10-CM | POA: Diagnosis not present

## 2024-07-19 DIAGNOSIS — D485 Neoplasm of uncertain behavior of skin: Secondary | ICD-10-CM | POA: Diagnosis not present

## 2024-07-19 DIAGNOSIS — D225 Melanocytic nevi of trunk: Secondary | ICD-10-CM | POA: Diagnosis not present

## 2024-07-19 DIAGNOSIS — L821 Other seborrheic keratosis: Secondary | ICD-10-CM | POA: Diagnosis not present

## 2024-07-19 DIAGNOSIS — L570 Actinic keratosis: Secondary | ICD-10-CM | POA: Diagnosis not present

## 2024-09-04 DIAGNOSIS — M109 Gout, unspecified: Secondary | ICD-10-CM | POA: Diagnosis not present

## 2024-09-04 DIAGNOSIS — I10 Essential (primary) hypertension: Secondary | ICD-10-CM | POA: Diagnosis not present

## 2024-09-04 DIAGNOSIS — E7800 Pure hypercholesterolemia, unspecified: Secondary | ICD-10-CM | POA: Diagnosis not present

## 2024-09-04 DIAGNOSIS — Z0000 Encounter for general adult medical examination without abnormal findings: Secondary | ICD-10-CM | POA: Diagnosis not present

## 2024-09-04 DIAGNOSIS — Z125 Encounter for screening for malignant neoplasm of prostate: Secondary | ICD-10-CM | POA: Diagnosis not present

## 2024-09-04 DIAGNOSIS — F5101 Primary insomnia: Secondary | ICD-10-CM | POA: Diagnosis not present

## 2024-09-13 DIAGNOSIS — Z125 Encounter for screening for malignant neoplasm of prostate: Secondary | ICD-10-CM | POA: Diagnosis not present

## 2024-09-13 DIAGNOSIS — E7800 Pure hypercholesterolemia, unspecified: Secondary | ICD-10-CM | POA: Diagnosis not present

## 2024-09-13 DIAGNOSIS — M109 Gout, unspecified: Secondary | ICD-10-CM | POA: Diagnosis not present

## 2024-09-13 DIAGNOSIS — I10 Essential (primary) hypertension: Secondary | ICD-10-CM | POA: Diagnosis not present

## 2025-01-03 NOTE — Assessment & Plan Note (Signed)
 SABRA

## 2025-01-03 NOTE — Progress Notes (Unsigned)
 "  Office Visit Note  Patient: Matthew Kidd             Date of Birth: Dec 08, 1955           MRN: [id number]             PCP: Arloa Elsie SAUNDERS, MD Referring: Arloa Elsie SAUNDERS, MD Visit Date: 01/15/2025   Subjective:  No chief complaint on file.   History of Present Illness: Matthew Kidd is a 69 y.o. male here for follow up for gout on allopurinol  100 mg daily.   Previous HPI 01/17/2024 Matthew Kidd is a 69 y.o. male here for follow up for gout on allopurinol  100 mg daily.  Gout has been doing well the entire past year he does not report any significant flareup of podagra with his classic gout symptoms.  He does occasionally get pain in the feet particular around the ankle and in the first MTP joint usually without much swelling.  Does have decreased range of motion the joints.  Currently has bruising left ankle which is from accidentally striking it.  No other significant medication aeromedical status changes.     Previous HPI 01/11/2023 Matthew Kidd is a 69 y.o. male here for follow up for gout on allopurinol  100 mg daily.  He has not had any recurrence of podagra since her last visit and not required any colchicine  treatment.  Apparently stopped hydrochlorothiazide treatment with his PCP without any worsening of blood pressure and has improved his amount of urinary frequency.  He still gets some joint pain in other areas and some osteoarthritis at the great toe.  More recently he is into some issue with pain at the left shoulder frequently bothers him lying in bed at night very often tries to sleep on his stomach with his arm in a raised position on his side due to snoring when supine.  Pain is not constant and not bothering him today.   Previous HPI 01/11/22 Matthew Kidd is a 69 y.o. male here for follow up for recurrent podagra with pain and very high CPR elevation uric acid was minimally above goal at 6.2 on initial visit and started allopurinol  100 mg daily. He is doing  well without severe disease flare since our last visit. He takes the colchicine  PRN when noticing any early signs starting up. The red and purple discolorations on his toe cleared up. He still has chronic 1st MTP stiffness.   Previous HPI 12/03/21 Matthew Kidd is a 69 y.o. male here for evaluation with left MTP pain and swelling.  He has had a total of 4 episodes during the past year more recent ones have been less severe due to improving with medication treatment.  This was initially suspected as gout and improved after local steroid injection and oral methylprednisolone  taper.  Subsequent episodes have improved with colchicine  though he has a lot of diarrhea when taking the medication which is also caused some exacerbation of hemorrhoids and other GI complaints. He has no prior history of gout before this past year.  No known family history of gout or other inflammatory joint diseases.  He has never had a kidney stone.  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  He had a history of multiple venous ablation procedures on the left leg.  Otherwise no history of blood clots no history of coronary or peripheral arterial disease. Labs demonstrated CRP elevation at 115.5 otherwise unremarkable with a normal  uric acid at the time of 6.1.  X-ray demonstrated moderately advanced osteoarthritis but also possible well-circumscribed erosive change at the first proximal phalanx.   No Rheumatology ROS completed.   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   Major depression single episode, in partial remission 10/08/2021   Migraine 10/08/2021   Pure hypercholesterolemia 10/08/2021   History of adenomatous polyp of colon 01/15/2020     Past Medical History:  Diagnosis Date   Hypertension    SCCA (squamous cell carcinoma) of skin 12/23/2020   Right Preauricular Area (in situ)    Family History  Problem Relation Age of Onset   Cancer Mother    Cancer Father    Hypertension Father    Stroke Father    Past Surgical History:  Procedure Laterality Date   CATARACT EXTRACTION Bilateral    COLONOSCOPY     HAND SURGERY     HERNIA REPAIR     LYMPHANGIOMA EXCISION     TONSILLECTOMY     VARICOSE VEIN SURGERY     Social History   Social History Narrative   Not on file   Immunization History  Administered Date(s) Administered   Influenza Split 11/17/2009, 08/11/2010, 08/18/2011, 09/03/2014   Influenza,inj,Quad PF,6+ Mos 07/22/2017, 08/04/2018   Influenza,inj,quad, With Preservative 10/19/2013, 07/23/2016   Influenza-Unspecified 07/28/2015   MMR 01/24/2007   PFIZER(Purple Top)SARS-COV-2 Vaccination 12/02/2019, 01/27/2020   Pfizer Covid-19 Vaccine Bivalent Booster 39yrs & up 08/31/2021   Tdap 08/17/2013     Objective: Vital Signs: There were no vitals taken for this visit.   Physical Exam   Musculoskeletal Exam: ***   Investigation: No additional findings.  Imaging: No results found.  Recent Labs: Lab Results  Component Value Date   WBC 10.5 09/25/2021   HGB 15.5 09/25/2021   PLT 197 09/25/2021    Speciality Comments: No specialty comments available.  Procedures:  No procedures performed Allergies: Codeine   Assessment / Plan:     Visit Diagnoses:  Assessment & Plan Podagra      ***  Follow-Up Instructions: No follow-ups on file.   Aracelly Tencza M Mikinzie Maciejewski, CMA  Note - This record has been created using [employer].  Chart creation errors have been sought, but may not always  have been located. Such creation errors do not reflect on  the standard of medical care. "

## 2025-01-15 ENCOUNTER — Ambulatory Visit: Payer: Medicare PPO | Admitting: Internal Medicine

## 2025-01-15 DIAGNOSIS — M109 Gout, unspecified: Secondary | ICD-10-CM
